# Patient Record
Sex: Male | Born: 1970 | Race: Black or African American | Hispanic: No | Marital: Married | Smoking: Never smoker
Health system: Southern US, Community
[De-identification: ages and names within clinical notes are randomized; demographics above are authoritative.]

## PROBLEM LIST (undated history)

## (undated) DIAGNOSIS — T7840XA Allergy, unspecified, initial encounter: Secondary | ICD-10-CM

## (undated) DIAGNOSIS — I10 Essential (primary) hypertension: Secondary | ICD-10-CM

## (undated) HISTORY — PX: COARCTATION OF AORTA REPAIR: SHX261

## (undated) HISTORY — DX: Allergy, unspecified, initial encounter: T78.40XA

---

## 1998-05-07 ENCOUNTER — Ambulatory Visit (HOSPITAL_COMMUNITY): Admission: RE | Admit: 1998-05-07 | Discharge: 1998-05-07 | Payer: Self-pay | Admitting: Family Medicine

## 2003-03-03 ENCOUNTER — Ambulatory Visit (HOSPITAL_COMMUNITY): Admission: RE | Admit: 2003-03-03 | Discharge: 2003-03-03 | Payer: Self-pay | Admitting: Cardiology

## 2003-03-03 ENCOUNTER — Encounter: Payer: Self-pay | Admitting: Cardiology

## 2003-03-19 ENCOUNTER — Encounter: Payer: Self-pay | Admitting: Cardiology

## 2003-03-19 ENCOUNTER — Ambulatory Visit (HOSPITAL_COMMUNITY): Admission: RE | Admit: 2003-03-19 | Discharge: 2003-03-19 | Payer: Self-pay | Admitting: Cardiology

## 2011-03-04 NOTE — Cardiovascular Report (Signed)
NAMEFAUSTINO, Mark Bruce                     ACCOUNT NO.:  [id number]   MEDICAL RECORD NO.:  [id number]                   PATIENT TYPE:  OIB   LOCATION:  2861                                 FACILITY:  [facility]   PHYSICIAN:  Cristy Hilts. Jacinto Halim, M.D.                  DATE OF BIRTH:  26-Aug-1971   DATE OF PROCEDURE:  03/03/2003  DATE OF DISCHARGE:                              CARDIAC CATHETERIZATION   REFERRING PHYSICIAN:  Renaye Rakers, M.D.   PROCEDURES:  1. Abdominal aortogram.  2. Ascending aortogram.  3. Aortic arch angiography.   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  His physical examination was  consistent with coarctation of the aorta, which is the suprarenal aorta.  Given this, he was brought to the catheterization lab to evaluate his aortic  arch.   HEMODYNAMIC DATA:  1. The ascending aortic pressures were 140/87 with a mean of 110 mmHg.  2. The descending thoracic aortic pressures were 99/82 with a mean of 91     mmHg.  3. There was a pressure gradient of 45 mmHg between the descending abdominal     aorta and the arch of the aorta.  This was highly suspicious for     coarctation.   ANGIOGRAPHIC DATA:  1. Abdominal aortogram:  The abdominal aortogram revealed widely patent     abdominal aorta.  There were two renal arteries, one on either side, and     they were widely patent.  The aortoiliac bifurcation was widely patent.     The iliac arteries were normal.  2. Ascending aorta and arch of the aorta:  The ascending aorta was normal.     There was  no evidence of aortic valve regurgitation and no evidence of     aortic dissection.  The right innominate artery was normal, and the left     subclavian artery was normal.  Just  off the origin of the left     subclavian artery there was coarctation of the aorta noted, which was     very focal.   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  This is hemodynamically significant  with a pressure gradient of 50 mmHg across the coarctation.   RECOMMENDATIONS:  The patient will proceed with evaluation of the  coarctation of the aorta by MRA.  This will also help in determining whether  he is a candidate for correction either by percutaneous or by surgical  route.  Further recommendations will follow.   DESCRIPTION OF PROCEDURE:  Under the usual sterile precautions using the  left brachial percutaneous arterial access approach with Seldinger  technique, a 5 French short arterial sheath was introduced into the brachial  artery.  Intra-arterial heparin 4000 units through the sheath and 200 mcg of  intra-arterial nitroglycerin were also administered prior to doing the  catheterization.  Then a 5 French pigtail catheter was easily introduced  into the arch of the aorta and with the help of the 0.035 inch Wholey wire,  the wire was manipulated to engage the descending thoracic aorta and the  pigtail catheter was transferred into the descending and thoracic aorta and  an abdominal aortogram was performed.  Then the catheter was manipulated,  pulled back into the arch of the aorta, and ascending  aortogram and arch aortogram were performed.  Hemodynamics were also  measured with calculation of pressure gradient across the coarctation.  Then  the catheter was pulled out of the body.  The patient tolerated the  procedure well.                                                Cristy Hilts. Jacinto Halim, M.D.    Pilar Plate  D:  03/03/2003  T:  03/04/2003  Job:  [id number]   cc:   Renaye Rakers, M.D.  [phone] [address], [address]  [place]  [place] [address]  Fax: [phone]

## 2012-05-22 ENCOUNTER — Emergency Department (HOSPITAL_COMMUNITY)
Admission: EM | Admit: 2012-05-22 | Discharge: 2012-05-22 | Disposition: A | Discharge status: Home / Self Care | Payer: BC Managed Care – PPO | Source: Home / Self Care | Attending: Emergency Medicine | Admitting: Emergency Medicine

## 2012-05-22 ENCOUNTER — Encounter (HOSPITAL_COMMUNITY): Payer: Self-pay

## 2012-05-22 DIAGNOSIS — K4090 Unilateral inguinal hernia, without obstruction or gangrene, not specified as recurrent: Secondary | ICD-10-CM

## 2012-05-22 HISTORY — DX: Essential (primary) hypertension: I10

## 2012-05-22 MED ORDER — HYDROCODONE-ACETAMINOPHEN 5-325 MG PO TABS: ORAL_TABLET | ORAL | Status: AC

## 2012-05-22 NOTE — ED Provider Notes (Signed)
Chief Complaint  Patient presents with  . Inguinal Hernia    History of Present Illness:    Mr. Marrow is a 41 year old male who has had a two-year history of a right inguinal hernia. He saw Dr. Claud Kelp for this last January and was told to have it repaired when started to bother him. It hadn't been bothering him much up until last night when he's had pain in the groin area. He is able to reduce the hernia. He denies any fever, chills, nausea, vomiting, or abdominal swelling. His bowel movements have been regular. He's had no urinary symptoms or testicular pain. He went to Dr. Jacinto Halim office today and was told that Dr. Derrell Lolling would not be able to see him until the 23rd of this month, but he felt that was too long to wait so he came here.  Review of Systems:  Other than noted above, the patient denies any of the following symptoms: Constitutional:  No fever, chills, fatigue, weight loss or anorexia. Lungs:  No cough or shortness of breath. Heart:  No chest pain, palpitations, syncope or edema.  No cardiac history. Abdomen:  No nausea, vomiting, hematememesis, melena, diarrhea, or hematochezia. GU:  No dysuria, frequency, urgency, or hematuria.  No testicular pain or swelling. Skin:  No rash or itching.  PMFSH:  Past medical history, family history, social history, meds, and allergies were reviewed along with nurse's notes.  No prior abdominal surgeries or history of GI problems.  No use of NSAIDs or aspirin.  No excessive  alcohol intake.  Physical Exam:   Vital signs:  BP 169/93  Pulse 80  Temp 98.5 F (36.9 C) (Oral)  Resp 18  SpO2 100% Gen:  Alert, oriented, in no distress. Lungs:  Breath sounds clear and equal bilaterally.  No wheezes, rales or rhonchi. Heart:  Regular rhythm.  No gallops or murmers.   Abdomen:  Abdomen is soft, flat, nondistended, and nontender. There is no organomegaly or mass. Bowel sounds are normal. He has a small, easily reducible, but tender right  inguinal hernia. His testes are normal. No testicular tenderness. Genital exam is normal. No urethral discharge. Skin:  Clear, warm and dry.  No rash.  Assessment:  The encounter diagnosis was Inguinal hernia. He has a symptomatic right inguinal hernia, but there is no evidence of incarceration or strangulation. There is no need for him to go to the emergency room today. I did call Dr. Jacinto Halim office and they did graciously consult to see him tomorrow at 2 PM. The patient was told in the meantime to stay off his feet, no heavy lifting, apply ice, and if he should develop any abdominal distention, nausea, or vomiting to go directly to the emergency room.  Plan:   1.  The following meds were prescribed:   New Prescriptions   HYDROCODONE-ACETAMINOPHEN (NORCO/VICODIN) 5-325 MG PER TABLET    1 to 2 tabs every 4 to 6 hours as needed for pain.   2.  The patient was instructed in symptomatic care and handouts were given. 3.  The patient was told to return if becoming worse in any way, if no better in 3 or 4 days, and given some red flag symptoms that would indicate earlier return.    Reuben Likes, MD 05/22/12 1200

## 2012-05-22 NOTE — ED Notes (Signed)
Pt states he has known rt inguinal hernia- was seen at [facility] Last Jan for same and told he did not need surgery.  Reports last night he began having sharp pain in the rt inguinal area. Reports pain is worse with movement and coughing.  States there has been swelling/"bulge" in the area for a while, this is not new.

## 2012-05-23 ENCOUNTER — Encounter (INDEPENDENT_AMBULATORY_CARE_PROVIDER_SITE_OTHER): Payer: Self-pay | Admitting: General Surgery

## 2012-05-23 ENCOUNTER — Ambulatory Visit (INDEPENDENT_AMBULATORY_CARE_PROVIDER_SITE_OTHER): Payer: BC Managed Care – PPO | Admitting: General Surgery

## 2012-05-23 VITALS — BP 170/82 | HR 72 | Temp 98.4°F | Resp 18 | Ht 72.0 in | Wt 220.0 lb

## 2012-05-23 DIAGNOSIS — K4090 Unilateral inguinal hernia, without obstruction or gangrene, not specified as recurrent: Secondary | ICD-10-CM

## 2012-05-23 NOTE — Progress Notes (Signed)
Patient ID: BERN FARE, male   DOB: [birth year], 41 y.o.   MRN: [id number]  Chief Complaint  Patient presents with  . New Evaluation    eval hernia    HPI Mark Bruce is a 41 y.o. male.  This patient is referred for evaluation of right inguinal hernia. He says that he has noticed this hernia with a bulge in his right groin for the last 2 years and has been mildly symptomatic over the last year. About one year ago he began having some discomfort in the area and he saw Dr. Derrell Lolling for evaluation and surgery was recommended. However, at the time, he did not have financial stability to perform the surgery. He has been taking Aleve for discomfort but it has been increasing in pain. 2 days ago he had increased "sharp" pains in the are of a seen at the urgent care facility and he was referred for surgery. He denies any nausea or vomiting or obstructive symptoms. He states that his bowels are normal. States that the bulge reduces. HPI  Past Medical History  Diagnosis Date  . Hypertension     No past surgical history on file.  No family history on file.  Social History History  Substance Use Topics  . Smoking status: Never Smoker   . Smokeless tobacco: Not on file  . Alcohol Use: No    No Known Allergies  Current Outpatient Prescriptions  Medication Sig Dispense Refill  . amLODipine (NORVASC) 10 MG tablet Take 10 mg by mouth daily.      . benazepril (LOTENSIN) 40 MG tablet Take 40 mg by mouth daily.      . hydrochlorothiazide (HYDRODIURIL) 25 MG tablet Take 25 mg by mouth daily.      Marland Kitchen HYDROcodone-acetaminophen (NORCO/VICODIN) 5-325 MG per tablet 1 to 2 tabs every 4 to 6 hours as needed for pain.  20 tablet  0    Review of Systems Review of Systems All other review of systems negative or noncontributory except as stated in the HPI  Blood pressure 170/82, pulse 72, temperature 98.4 F (36.9 C), resp. rate 18, height 6' (1.829 m), weight 220 lb (99.791 kg).  Physical  Exam Physical Exam Physical Exam  Vitals reviewed. Constitutional: He is oriented to person, place, and time. He appears well-developed and well-nourished. No distress.  HENT:  Head: Normocephalic and atraumatic.  Mouth/Throat: No oropharyngeal exudate.  Eyes: Conjunctivae and EOM are normal. Pupils are equal, round, and reactive to light. Right eye exhibits no discharge. Left eye exhibits no discharge. No scleral icterus.  Neck: Normal range of motion. No tracheal deviation present.  Cardiovascular: Normal rate, regular rhythm and normal heart sounds.   Pulmonary/Chest: Effort normal and breath sounds normal. No stridor. No respiratory distress. He has no wheezes. He has no rales. He exhibits no tenderness.  Abdominal: Soft. Bowel sounds are normal. He exhibits no distension and no mass. There is no tenderness. There is no rebound and no guarding. he has a moderate size reducible right inguinal bulge. Again, this is easily reducible on exam. No left anal hernia is appreciated. Musculoskeletal: Normal range of motion. He exhibits no edema and no tenderness.  Neurological: He is alert and oriented to person, place, and time.  Skin: Skin is warm and dry. No rash noted. He is not diaphoretic. No erythema. No pallor.  Psychiatric: He has a normal mood and affect. His behavior is normal. Judgment and thought content normal.    Data Reviewed  Assessment    Reducible right inguinal hernia-symptomatic He does have a reducible right inguinal hernia on exam which is symptomatic. I discussed with him the options for watchful waiting versus laparoscopic or open hernia repair and he is interested in open right inguinal hernia repair. We discussed the risks of surgery including infection, bleeding, pain, scarring, recurrence, injury to the testicle or vas deferens, chronic pain, numbness, or nerve injury and he expressed understanding and would like to proceed with open right inguinal hernia repair with  mesh as soon as possible.    Plan    We will schedule open right inguinal hernia repair with mesh as soon as possible       Mark Bruce DAVID 05/23/2012, 2:30 PM

## 2012-06-07 DIAGNOSIS — K4090 Unilateral inguinal hernia, without obstruction or gangrene, not specified as recurrent: Secondary | ICD-10-CM

## 2012-06-07 HISTORY — PX: HERNIA REPAIR: SHX51

## 2012-06-08 ENCOUNTER — Ambulatory Visit (INDEPENDENT_AMBULATORY_CARE_PROVIDER_SITE_OTHER): Payer: BC Managed Care – PPO | Admitting: General Surgery

## 2012-06-27 ENCOUNTER — Telehealth (INDEPENDENT_AMBULATORY_CARE_PROVIDER_SITE_OTHER): Payer: Self-pay

## 2012-06-27 NOTE — Telephone Encounter (Signed)
Left message notifying patient of appointment for 06/28/12 has been rescheduled due to office cancellation.  Patient has new follow up appointment for 07/06/12 w/Dr. Biagio Quint.

## 2012-06-28 ENCOUNTER — Encounter (INDEPENDENT_AMBULATORY_CARE_PROVIDER_SITE_OTHER): Payer: BC Managed Care – PPO | Admitting: General Surgery

## 2012-07-06 ENCOUNTER — Encounter (INDEPENDENT_AMBULATORY_CARE_PROVIDER_SITE_OTHER): Payer: Self-pay | Admitting: General Surgery

## 2012-07-06 ENCOUNTER — Ambulatory Visit (INDEPENDENT_AMBULATORY_CARE_PROVIDER_SITE_OTHER): Payer: BC Managed Care – PPO | Admitting: General Surgery

## 2012-07-06 VITALS — BP 182/100 | HR 68 | Temp 97.8°F | Resp 16 | Ht 72.0 in | Wt 220.6 lb

## 2012-07-06 DIAGNOSIS — Z4889 Encounter for other specified surgical aftercare: Secondary | ICD-10-CM

## 2012-07-06 DIAGNOSIS — Z5189 Encounter for other specified aftercare: Secondary | ICD-10-CM

## 2012-07-06 NOTE — Progress Notes (Signed)
Subjective:     Patient ID: Mark Bruce, male   DOB: [birth year], 41 y.o.   MRN: [id number]  HPI This patient follows up one month status post open repair of a right inguinal hernia. He has no complaints. He is off pain medication and is gradually increasing his activity. He denies any discomfort in area.He is scheduled to return to work October 3. Review of Systems     Objective:   Physical Exam No distress and nontoxic-appearing Is incision is healing well without sign of infection. He has a normal healing ridge. There is no evidence of recurrent hernia with Valsalva    Assessment:     Status post open right inguinal hernia He is doing very well from this procedure and has no evidence of any postoperative complications. He can gradually increase his activity as tolerated and return to work without limitations on October 3. He can followup with me on a p.r.n. basis.       Plan:     He can follow up with me on a p.r.n. basis and gradually increase his activity as tolerated.

## 2015-09-16 ENCOUNTER — Encounter: Payer: Self-pay | Admitting: Family Medicine

## 2015-09-16 DIAGNOSIS — T7840XA Allergy, unspecified, initial encounter: Secondary | ICD-10-CM | POA: Insufficient documentation

## 2015-09-16 DIAGNOSIS — I10 Essential (primary) hypertension: Secondary | ICD-10-CM | POA: Insufficient documentation

## 2015-09-22 ENCOUNTER — Encounter: Payer: Self-pay | Admitting: Family Medicine

## 2015-09-22 ENCOUNTER — Ambulatory Visit (INDEPENDENT_AMBULATORY_CARE_PROVIDER_SITE_OTHER): Payer: BLUE CROSS/BLUE SHIELD | Admitting: Family Medicine

## 2015-09-22 VITALS — BP 140/88 | HR 82 | Temp 98.3°F | Resp 16 | Ht 72.0 in | Wt 230.0 lb

## 2015-09-22 DIAGNOSIS — Z86010 Personal history of colonic polyps: Secondary | ICD-10-CM | POA: Diagnosis not present

## 2015-09-22 DIAGNOSIS — I10 Essential (primary) hypertension: Secondary | ICD-10-CM | POA: Diagnosis not present

## 2015-09-22 NOTE — Assessment & Plan Note (Signed)
Mild elevation today. I'm going to obtain the records from his previous primary as well as the cardiologist. He has a soft systolic murmur asked him this is why he had a workup in associated with his hypertension at a young age.

## 2015-09-22 NOTE — Patient Instructions (Addendum)
Continue current medications  Release of records- Dr. Loleta ChanceHill Release of records- Dr. Jacinto HalimGanji F/U 6 months Physical

## 2015-09-22 NOTE — Progress Notes (Signed)
Patient ID: Mark Bruce, male   DOB: Jul 15, 1971, 44 y.o.   MRN: [id number]      Subjective:    Patient ID: Mark Bruce, male    DOB: Jul 15, 1971, 44 y.o.   MRN: [id number]  [facility]  patient here to establish care. Previous primary care provider Dr. Loleta ChanceHill. He was at that office for the past 10 years. He has history of hypertension which is diagnosed about 10 years ago. He states that he was on 3 different blood pressure medications but they stopped one a few months ago. He is currently on amlodipine 10 mg then Hyzaar 100/25 mg once a day. He is history of heart murmur. He states that he had some type of dye study done when I look back in the chart he had procedure done back in 2004 which would've been around the time he was diagnosed with hypertension. He states that it was no blockages anywhere. He also has mild seasonal allergies but otherwise no other medical problems. He does have family history of colon cancer his father passed away from colon cancer at age 42 the time that they found that it had already metastasized. He had colonoscopy at age 44 he had 1 polyp removed. He does not recall the gastroenterologist.  He was seen by the eye doctor as well recently he does have some glaucoma in his family his examination was normal. He also follows with Dr. Garner Nashaniels for dental health in [place]  Recent labs done with his physical  Review Of Systems:  GEN- denies fatigue, fever, weight loss,weakness, recent illness HEENT- denies eye drainage, change in vision, nasal discharge, CVS- denies chest pain, palpitations RESP- denies SOB, cough, wheeze ABD- denies N/V, change in stools, abd pain GU- denies dysuria, hematuria, dribbling, incontinence MSK- denies joint pain, muscle aches, injury Neuro- denies headache, dizziness, syncope, seizure activity       Objective:    BP 140/88 mmHg  Pulse 82  Temp(Src) 98.3 F (36.8 C) (Oral)   Resp 16  Ht 6' (1.829 m)  Wt 230 lb (104.327 kg)  BMI 31.19 kg/m2 GEN- NAD, alert and oriented x3 , BP recheck 148/88 HEENT- PERRL, EOMI, non injected sclera, pink conjunctiva, MMM, oropharynx clear Neck- Supple, no thyromegaly CVS- RRR, no murmur RESP-CTAB ABD-NABS,soft,NT,ND Psych- normal affect and mood  EXT- No edema Pulses- Radial, DP- 2+        Assessment & Plan:      Problem List Items Addressed This Visit    Hypertension - Primary    Mild elevation today. I'm going to obtain the records from his previous primary as well as the cardiologist. He has a soft systolic murmur asked him this is why he had a workup in associated with his hypertension at a young age.       Relevant Medications   losartan-hydrochlorothiazide (HYZAAR) 100-25 MG tablet   History of colonic polyps      Note: This dictation was prepared with Dragon dictation along with smaller phrase technology. Any transcriptional errors that result from this process are unintentional.

## 2015-10-07 ENCOUNTER — Telehealth: Payer: Self-pay | Admitting: Family Medicine

## 2015-10-07 NOTE — Telephone Encounter (Signed)
Pt was told to call and let Dr. Jeanice Limurham know Dr. Amlodipine 10 mg.   [facility] [phone]

## 2015-10-09 ENCOUNTER — Telehealth: Payer: Self-pay | Admitting: *Deleted

## 2015-10-09 MED ORDER — AMLODIPINE BESYLATE 10 MG PO TABS: 10.0000 mg | ORAL_TABLET | Freq: Every day | ORAL | Status: DC

## 2015-10-09 NOTE — Telephone Encounter (Signed)
Patient should continue current medications.   Prescription sent to pharmacy.

## 2015-10-09 NOTE — Telephone Encounter (Signed)
Pt called needing refill on Amlodipine states is new pt was seen by Dr. Jeanice Limurham and was not sure if he is suppose to be taking the meds, there was a lot of confusion. Pt only wants 30 days right now.  [facility]

## 2015-11-11 ENCOUNTER — Other Ambulatory Visit: Payer: Self-pay | Admitting: *Deleted

## 2015-11-11 MED ORDER — AMLODIPINE BESYLATE 10 MG PO TABS: 10.0000 mg | ORAL_TABLET | Freq: Every day | ORAL | Status: DC

## 2015-11-11 NOTE — Telephone Encounter (Signed)
Received fax requesting refill on Norvasc.   Refill appropriate and filled per protocol.  

## 2015-12-01 ENCOUNTER — Other Ambulatory Visit: Payer: Self-pay | Admitting: Family Medicine

## 2015-12-01 NOTE — Telephone Encounter (Signed)
Refill appropriate and filled per protocol. 

## 2015-12-16 ENCOUNTER — Encounter: Payer: Self-pay | Admitting: Family Medicine

## 2015-12-16 ENCOUNTER — Telehealth: Payer: Self-pay | Admitting: *Deleted

## 2015-12-16 ENCOUNTER — Ambulatory Visit (INDEPENDENT_AMBULATORY_CARE_PROVIDER_SITE_OTHER): Payer: BLUE CROSS/BLUE SHIELD | Admitting: Family Medicine

## 2015-12-16 VITALS — BP 152/84 | HR 70 | Temp 98.1°F | Resp 14 | Ht 72.0 in | Wt 227.0 lb

## 2015-12-16 DIAGNOSIS — Z0000 Encounter for general adult medical examination without abnormal findings: Secondary | ICD-10-CM | POA: Diagnosis not present

## 2015-12-16 DIAGNOSIS — Z23 Encounter for immunization: Secondary | ICD-10-CM | POA: Diagnosis not present

## 2015-12-16 DIAGNOSIS — Z125 Encounter for screening for malignant neoplasm of prostate: Secondary | ICD-10-CM

## 2015-12-16 DIAGNOSIS — K4090 Unilateral inguinal hernia, without obstruction or gangrene, not specified as recurrent: Secondary | ICD-10-CM | POA: Diagnosis not present

## 2015-12-16 DIAGNOSIS — E669 Obesity, unspecified: Secondary | ICD-10-CM | POA: Diagnosis not present

## 2015-12-16 DIAGNOSIS — R5383 Other fatigue: Secondary | ICD-10-CM | POA: Diagnosis not present

## 2015-12-16 DIAGNOSIS — I10 Essential (primary) hypertension: Secondary | ICD-10-CM | POA: Diagnosis not present

## 2015-12-16 DIAGNOSIS — N400 Enlarged prostate without lower urinary tract symptoms: Secondary | ICD-10-CM

## 2015-12-16 DIAGNOSIS — E663 Overweight: Secondary | ICD-10-CM

## 2015-12-16 LAB — TSH: TSH: 1.48 m[IU]/L (ref 0.40–4.50)

## 2015-12-16 LAB — COMPREHENSIVE METABOLIC PANEL
ALK PHOS: 58 U/L (ref 40–115)
ALT: 21 U/L (ref 9–46)
AST: 16 U/L (ref 10–40)
Albumin: 4.6 g/dL (ref 3.6–5.1)
BUN: 9 mg/dL (ref 7–25)
CO2: 26 mmol/L (ref 20–31)
CREATININE: 0.82 mg/dL (ref 0.60–1.35)
Calcium: 9.4 mg/dL (ref 8.6–10.3)
Chloride: 101 mmol/L (ref 98–110)
GLUCOSE: 86 mg/dL (ref 70–99)
Potassium: 3.6 mmol/L (ref 3.5–5.3)
SODIUM: 135 mmol/L (ref 135–146)
Total Bilirubin: 1.3 mg/dL — ABNORMAL HIGH (ref 0.2–1.2)
Total Protein: 7.8 g/dL (ref 6.1–8.1)

## 2015-12-16 LAB — CBC WITH DIFFERENTIAL/PLATELET
BASOS ABS: 0 10*3/uL (ref 0.0–0.1)
BASOS PCT: 1 % (ref 0–1)
EOS PCT: 5 % (ref 0–5)
Eosinophils Absolute: 0.2 10*3/uL (ref 0.0–0.7)
HCT: 44.3 % (ref 39.0–52.0)
Hemoglobin: 14.8 g/dL (ref 13.0–17.0)
LYMPHS PCT: 46 % (ref 12–46)
Lymphs Abs: 2.1 10*3/uL (ref 0.7–4.0)
MCH: 33.6 pg (ref 26.0–34.0)
MCHC: 33.4 g/dL (ref 30.0–36.0)
MCV: 100.7 fL — ABNORMAL HIGH (ref 78.0–100.0)
MONO ABS: 0.5 10*3/uL (ref 0.1–1.0)
MPV: 10.5 fL (ref 8.6–12.4)
Monocytes Relative: 11 % (ref 3–12)
NEUTROS ABS: 1.7 10*3/uL (ref 1.7–7.7)
Neutrophils Relative %: 37 % — ABNORMAL LOW (ref 43–77)
Platelets: 194 10*3/uL (ref 150–400)
RBC: 4.4 MIL/uL (ref 4.22–5.81)
RDW: 12.2 % (ref 11.5–15.5)
WBC: 4.6 10*3/uL (ref 4.0–10.5)

## 2015-12-16 LAB — LIPID PANEL
Cholesterol: 149 mg/dL (ref 125–200)
HDL: 47 mg/dL (ref >=40)
LDL CALC: 79 mg/dL (ref <130)
Total CHOL/HDL Ratio: 3.2 Ratio (ref <=5.0)
Triglycerides: 115 mg/dL (ref <150)
VLDL: 23 mg/dL (ref <30)

## 2015-12-16 NOTE — Assessment & Plan Note (Signed)
The pressure still elevated today however he is a little nervous. We then consider adding Hytrin to his meds as this will help with his BPH.

## 2015-12-16 NOTE — Telephone Encounter (Signed)
Pt has appt scheduled for Mon March 6 at 1:30pm with arrival at 1:10pm, pt is to drink 32oz water one hr prior to appt and hold, pt appt at [facility] imaging at [facility] center at [address] [place], [facility] to pt for appt

## 2015-12-16 NOTE — Progress Notes (Signed)
Patient ID: Mark Bruce, male   DOB: 02/12/1971, 45 y.o.   MRN: [id number]   Subjective:    Patient ID: Mark Bruce, male    DOB: 02-07-1971, 45 y.o.   MRN: [id number]  Patient presents for CPE  here for complete physical exam. He still taken his blood pressure medicine as prescribed. He has a few concerns today. He had inguinal hernia surgery done on the right side in 2013 for the past few months he's felt pain in the same area if he lifts or does any type of Valsalva maneuver he will get significant pain he has not had any bulge that he notices.  He's had difficulty with his urine stream he noticed that it is slower than usual he is not to the point of dripping. He denies any dysuria. He would like to have a PSA done and to have a prostate check. He also states that he has had difficulty with his erections and some fatigue she would like to have his testosterone checked. He is aware that his hypertension since his young 20s and his medications contribute to some of his erectile problems.  Review Of Systems:  GEN- denies fatigue, fever, weight loss,weakness, recent illness HEENT- denies eye drainage, change in vision, nasal discharge, CVS- denies chest pain, palpitations RESP- denies SOB, cough, wheeze ABD- denies N/V, change in stools, abd pain GU- denies dysuria, hematuria, dribbling, incontinence MSK- denies joint pain, muscle aches, injury Neuro- denies headache, dizziness, syncope, seizure activity       Objective:    BP 152/84 mmHg  Pulse 70  Temp(Src) 98.1 F (36.7 C) (Oral)  Resp 14  Ht 6' (1.829 m)  Wt 227 lb (102.967 kg)  BMI 30.78 kg/m2 GEN- NAD, alert and oriented x3 HEENT- PERRL, EOMI, non injected sclera, pink conjunctiva, MMM, oropharynx clear Neck- Supple, no thyromegaly CVS- RRR, no murmur RESP-CTAB ABD-NABS,soft,NT,ND GU- prostate enlarged, no nodules, FOBT neg, normal rectal tone, small scabs previous boils on buttocks, penis normal, scrotum  no bulge, previous hernia site, mild TTP, no swelling  EXT- No edema Pulses- Radial, DP- 2+        Assessment & Plan:      Problem List Items Addressed This Visit    Obesity   Hypertension    The pressure still elevated today however he is a little nervous. We then consider adding Hytrin to his meds as this will help with his BPH.      BPH (benign prostatic hypertrophy)    Other Visit Diagnoses    Routine general medical examination at a health care facility    -  Primary    CPE done, TDAP given, fasting labs    Relevant Orders    CBC with Differential/Platelet    Comprehensive metabolic panel    Lipid panel    Prostate cancer screening        Enlarged prostate, check PSA, will likely try Hytrin due to his HTN    Relevant Orders    PSA    Other fatigue        Check testosterone, TSH    Relevant Orders    TSH    Testosterone    Unilateral inguinal hernia without obstruction or gangrene, recurrence not specified        Previous hernia site, with pain, no bulge seen, ? if he has recurrence, pelvic US to be done     Relevant Orders    US Pelvis Limited  Note: This dictation was prepared with Dragon dictation along with smaller phrase technology. Any transcriptional errors that result from this process are unintentional.

## 2015-12-16 NOTE — Patient Instructions (Addendum)
Release of records- Gastroenterology ( Leave Blank )  Ultrasound of hernia - evening appt  We will call with lab results  Tetanus Booster given  F/u 6 MONTHS

## 2015-12-17 LAB — PSA: PSA: 2.3 ng/mL (ref <=4.00)

## 2015-12-17 LAB — TESTOSTERONE: TESTOSTERONE: 410 ng/dL (ref 250–827)

## 2015-12-17 NOTE — Telephone Encounter (Signed)
Pt aware of appt.

## 2015-12-21 ENCOUNTER — Ambulatory Visit
Admission: RE | Admit: 2015-12-21 | Discharge: 2015-12-21 | Disposition: A | Discharge status: Home / Self Care | Payer: BLUE CROSS/BLUE SHIELD | Source: Ambulatory Visit | Attending: Family Medicine | Admitting: Family Medicine

## 2015-12-21 ENCOUNTER — Other Ambulatory Visit: Payer: Self-pay | Admitting: *Deleted

## 2015-12-21 DIAGNOSIS — K4090 Unilateral inguinal hernia, without obstruction or gangrene, not specified as recurrent: Secondary | ICD-10-CM

## 2015-12-21 MED ORDER — TERAZOSIN HCL 1 MG PO CAPS: 1.0000 mg | ORAL_CAPSULE | Freq: Every day | ORAL | Status: DC

## 2016-01-28 ENCOUNTER — Encounter: Payer: Self-pay | Admitting: Family Medicine

## 2016-02-24 ENCOUNTER — Other Ambulatory Visit: Payer: Self-pay | Admitting: *Deleted

## 2016-02-24 MED ORDER — TERAZOSIN HCL 1 MG PO CAPS: 1.0000 mg | ORAL_CAPSULE | Freq: Every day | ORAL | Status: DC

## 2016-02-24 NOTE — Telephone Encounter (Signed)
Received fax requesting refill on terazosin with 90 day supply.   Refill appropriate and filled per protocol.

## 2016-05-23 ENCOUNTER — Other Ambulatory Visit: Payer: Self-pay | Admitting: Family Medicine

## 2016-11-22 ENCOUNTER — Other Ambulatory Visit: Payer: Self-pay | Admitting: Family Medicine

## 2016-11-22 NOTE — Telephone Encounter (Signed)
Medication refill for one time only.  Patient needs to be seen.  

## 2016-12-19 ENCOUNTER — Other Ambulatory Visit: Payer: Self-pay | Admitting: *Deleted

## 2016-12-19 NOTE — Telephone Encounter (Signed)
Received VM from patient.   Requested to schedule appointment with MD.   Please call patient to schedule.

## 2016-12-21 MED ORDER — LOSARTAN POTASSIUM-HCTZ 100-25 MG PO TABS: ORAL_TABLET | ORAL | 0 refills | Status: DC

## 2016-12-21 MED ORDER — AMLODIPINE BESYLATE 10 MG PO TABS: 10.0000 mg | ORAL_TABLET | Freq: Every day | ORAL | 0 refills | Status: DC

## 2016-12-21 NOTE — Telephone Encounter (Signed)
Is running out of BP meds.  Told needs to make appt to be seen.  Made appt for early morning next week so he can come before work.  Told him to come fasting for any labs provider may want to do.  Will send 1 month supply only of BP meds so he does not run out.

## 2016-12-28 ENCOUNTER — Ambulatory Visit (INDEPENDENT_AMBULATORY_CARE_PROVIDER_SITE_OTHER): Payer: Managed Care, Other (non HMO) | Admitting: Family Medicine

## 2016-12-28 ENCOUNTER — Encounter: Payer: Self-pay | Admitting: Family Medicine

## 2016-12-28 VITALS — BP 142/68 | HR 88 | Temp 98.9°F | Resp 14 | Ht 72.0 in | Wt 227.0 lb

## 2016-12-28 DIAGNOSIS — E6609 Other obesity due to excess calories: Secondary | ICD-10-CM | POA: Diagnosis not present

## 2016-12-28 DIAGNOSIS — N401 Enlarged prostate with lower urinary tract symptoms: Secondary | ICD-10-CM

## 2016-12-28 DIAGNOSIS — Z6830 Body mass index (BMI) 30.0-30.9, adult: Secondary | ICD-10-CM

## 2016-12-28 DIAGNOSIS — I10 Essential (primary) hypertension: Secondary | ICD-10-CM

## 2016-12-28 DIAGNOSIS — L309 Dermatitis, unspecified: Secondary | ICD-10-CM | POA: Diagnosis not present

## 2016-12-28 LAB — CBC WITH DIFFERENTIAL/PLATELET
BASOS ABS: 40 {cells}/uL (ref 0–200)
Basophils Relative: 1 %
EOS ABS: 240 {cells}/uL (ref 15–500)
Eosinophils Relative: 6 %
HEMATOCRIT: 44.9 % (ref 38.5–50.0)
Hemoglobin: 15.1 g/dL (ref 13.0–17.0)
Lymphocytes Relative: 38 %
Lymphs Abs: 1520 cells/uL (ref 850–3900)
MCH: 34.5 pg — AB (ref 27.0–33.0)
MCHC: 33.6 g/dL (ref 32.0–36.0)
MCV: 102.5 fL — AB (ref 80.0–100.0)
MONO ABS: 480 {cells}/uL (ref 200–950)
MONOS PCT: 12 %
MPV: 10 fL (ref 7.5–12.5)
NEUTROS ABS: 1720 {cells}/uL (ref 1500–7800)
Neutrophils Relative %: 43 %
PLATELETS: 174 10*3/uL (ref 140–400)
RBC: 4.38 MIL/uL (ref 4.20–5.80)
RDW: 12.4 % (ref 11.0–15.0)
WBC: 4 10*3/uL (ref 3.8–10.8)

## 2016-12-28 LAB — COMPREHENSIVE METABOLIC PANEL
ALBUMIN: 4.2 g/dL (ref 3.6–5.1)
ALT: 28 U/L (ref 9–46)
AST: 24 U/L (ref 10–40)
Alkaline Phosphatase: 53 U/L (ref 40–115)
BUN: 10 mg/dL (ref 7–25)
CALCIUM: 9.4 mg/dL (ref 8.6–10.3)
CHLORIDE: 101 mmol/L (ref 98–110)
CO2: 29 mmol/L (ref 20–31)
Creat: 0.93 mg/dL (ref 0.60–1.35)
GLUCOSE: 102 mg/dL — AB (ref 70–99)
POTASSIUM: 4.2 mmol/L (ref 3.5–5.3)
Sodium: 138 mmol/L (ref 135–146)
TOTAL PROTEIN: 7.2 g/dL (ref 6.1–8.1)
Total Bilirubin: 1.3 mg/dL — ABNORMAL HIGH (ref 0.2–1.2)

## 2016-12-28 LAB — LIPID PANEL
CHOL/HDL RATIO: 3.2 ratio (ref <5.0)
Cholesterol: 168 mg/dL (ref <200)
HDL: 53 mg/dL (ref >40)
LDL Cholesterol: 98 mg/dL (ref <100)
Triglycerides: 87 mg/dL (ref <150)
VLDL: 17 mg/dL (ref <30)

## 2016-12-28 LAB — PSA: PSA: 1.7 ng/mL (ref <=4.0)

## 2016-12-28 MED ORDER — CLOBETASOL PROPIONATE 0.05 % EX CREA: 1.0000 "application " | TOPICAL_CREAM | Freq: Two times a day (BID) | CUTANEOUS | 1 refills | Status: DC

## 2016-12-28 NOTE — Assessment & Plan Note (Signed)
Blood pressure rechecked and was improved after he sat for a few. I think he would benefit from taking the Terazosin regularly at bedtime in addition to his other 2 blood pressure medications especially with his examination coming up. We also discussed other dietary changes including no additional salt added increasing his water and starting exercise back. He does avoid a monitor blood pressure at home. I will obtain some fasting labs today.

## 2016-12-28 NOTE — Progress Notes (Signed)
   Subjective:    Patient ID: Mark Bruce, male    DOB: 04-08-1971, 46 y.o.   MRN: [id number]  Patient presents for Follow-up (is fasting) Patient here to follow-up his hypertension He was last seen a year ago. 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 He's taken his him loaded pain in the Hyzaar but he is not taking the Terrazas and regular. We will uses for both blood pressure as well as BPH he did not notice it big difference in his symptoms for the BPH therefore he takes very randomly. He did have some increase in weight. He has been able to get that back off. He has changed his diet significantly over the past month only grilled foods veggies increasing his water. He is not back to exercising however  He does have a rashes typically pops up during the winter months that is itchy he has it on his lower back as well as his abdomen and his right arm and always come to the same place Review Of Systems:  GEN- denies fatigue, fever, weight loss,weakness, recent illness HEENT- denies eye drainage, change in vision, nasal discharge, CVS- denies chest pain, palpitations RESP- denies SOB, cough, wheeze ABD- denies N/V, change in stools, abd pain GU- denies dysuria, hematuria, dribbling, incontinence MSK- denies joint pain, muscle aches, injury Neuro- denies headache, dizziness, syncope, seizure activity       Objective:    BP (!) 142/68   Pulse 88   Temp 98.9 F (37.2 C) (Oral)   Resp 14   Ht 6' (1.829 m)   Wt 227 lb (103 kg)   SpO2 98%   BMI 30.79 kg/m  GEN- NAD, alert and oriented x3   Right 138/72  Left 140/76 HEENT- PERRL, EOMI, non injected sclera, pink conjunctiva, MMM, oropharynx clear Neck- Supple, no thyromegaly CVS- RRR, no murmur RESP-CTAB ABD-NABS,soft,NT,ND Skin- above gluteal cleft plaque like lesion with mild erythema quarter size, 2 small dime size like lesions on right abdomen and right upper arm  EXT- No edema Pulses- Radial,  DP- 2+        Assessment & Plan:      Problem List Items Addressed This Visit    Obesity   Hypertension - Primary    Blood pressure rechecked and was improved after he sat for a few. I think he would benefit from taking the Terazosin regularly at bedtime in addition to his other 2 blood pressure medications especially with his examination coming up. We also discussed other dietary changes including no additional salt added increasing his water and starting exercise back. He does avoid a monitor blood pressure at home. I will obtain some fasting labs today.      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 or just smaller psoriatic lesions, trial of clobetasol      Note: This dictation was prepared with Dragon dictation along with smaller phrase technology. Any transcriptional errors that result from this process are unintentional.

## 2016-12-28 NOTE — Patient Instructions (Addendum)
F/u 6 MONTH for PHYSICAL

## 2016-12-30 ENCOUNTER — Encounter: Payer: Self-pay | Admitting: *Deleted

## 2017-01-17 ENCOUNTER — Other Ambulatory Visit: Payer: Self-pay | Admitting: Family Medicine

## 2017-01-17 NOTE — Telephone Encounter (Signed)
Medication refilled per protocol. 

## 2017-02-13 ENCOUNTER — Other Ambulatory Visit: Payer: Self-pay | Admitting: *Deleted

## 2017-02-13 MED ORDER — LOSARTAN POTASSIUM-HCTZ 100-25 MG PO TABS: 1.0000 | ORAL_TABLET | Freq: Every day | ORAL | 1 refills | Status: DC

## 2017-02-13 MED ORDER — TERAZOSIN HCL 1 MG PO CAPS: 1.0000 mg | ORAL_CAPSULE | Freq: Every day | ORAL | 1 refills | Status: DC

## 2017-02-13 MED ORDER — AMLODIPINE BESYLATE 10 MG PO TABS
10.0000 mg | ORAL_TABLET | Freq: Every day | ORAL | 1 refills | Status: DC
Start: 2017-02-13 — End: 2017-02-17

## 2017-02-17 ENCOUNTER — Other Ambulatory Visit: Payer: Self-pay | Admitting: Family Medicine

## 2017-06-30 ENCOUNTER — Encounter: Payer: Self-pay | Admitting: Family Medicine

## 2017-06-30 ENCOUNTER — Ambulatory Visit (INDEPENDENT_AMBULATORY_CARE_PROVIDER_SITE_OTHER): Payer: Managed Care, Other (non HMO) | Admitting: Family Medicine

## 2017-06-30 VITALS — BP 140/78 | HR 77 | Temp 98.8°F | Resp 16 | Ht 72.0 in | Wt 217.0 lb

## 2017-06-30 DIAGNOSIS — R7301 Impaired fasting glucose: Secondary | ICD-10-CM

## 2017-06-30 DIAGNOSIS — I10 Essential (primary) hypertension: Secondary | ICD-10-CM

## 2017-06-30 DIAGNOSIS — E663 Overweight: Secondary | ICD-10-CM

## 2017-06-30 DIAGNOSIS — Z23 Encounter for immunization: Secondary | ICD-10-CM | POA: Diagnosis not present

## 2017-06-30 DIAGNOSIS — Z0000 Encounter for general adult medical examination without abnormal findings: Secondary | ICD-10-CM

## 2017-06-30 NOTE — Assessment & Plan Note (Signed)
Bp looks okay, borderline elevated systolic He will monitor at home, no change to meds today

## 2017-06-30 NOTE — Addendum Note (Signed)
Addended by: Phineas Semen A on: 06/30/2017 10:27 AM   Modules accepted: Orders

## 2017-06-30 NOTE — Assessment & Plan Note (Signed)
Continue work on diet, add aerobic exercise with his HTN will help with BP and weight

## 2017-06-30 NOTE — Patient Instructions (Signed)
F/U 6 months for blood pressure  We will call with lab results  Schedule eye appointment

## 2017-06-30 NOTE — Progress Notes (Signed)
   Subjective:    Patient ID: Mark Bruce, male    DOB: 05-09-1971, 46 y.o.   MRN: [id number]  Patient presents for Annual Exam Here for physical exam. His medications reviewed family history reviewed Hypertension he is on amlodipine as well as Hyzaar he also has Hytrin which is supposed to help with both his blood pressure and his BPH He's lost 10 pounds in the past 6 months Tetanus up-to-date declines flu shot Discussed HIV screening If fasting labs in 6 months ago his cholesterol was normal his fasting blood glucose was slightly elevated PSA was normal   Colonoscopy UTD had in 2015, due in 2020    Review Of Systems:  GEN- denies fatigue, fever, weight loss,weakness, recent illness HEENT- denies eye drainage, change in vision, nasal discharge, CVS- denies chest pain, palpitations RESP- denies SOB, cough, wheeze ABD- denies N/V, change in stools, abd pain GU- denies dysuria, hematuria, dribbling, incontinence MSK- denies joint pain, muscle aches, injury Neuro- denies headache, dizziness, syncope, seizure activity       Objective:    BP 140/78   Pulse 77   Temp 98.8 F (37.1 C) (Oral)   Resp 16   Ht 6' (1.829 m)   Wt 217 lb (98.4 kg)   SpO2 97%   BMI 29.43 kg/m  GEN- NAD, alert and oriented x3 HEENT- PERRL, EOMI, non injected sclera, pink conjunctiva, MMM, oropharynx clear, TM clear bilat, no effusion Neck- Supple, no thyromegaly CVS- RRR, no murmur RESP-CTAB ABD-NABS,soft,NT,ND EXT- No edema Pulses- Radial, DP- 2+        Assessment & Plan:      Problem List Items Addressed This Visit      Unprioritized   Overweight (BMI 25.0-29.9)    Continue work on diet, add aerobic exercise with his HTN will help with BP and weight      Hypertension    Bp looks okay, borderline elevated systolic He will monitor at home, no change to meds today      Relevant Orders   CBC with Differential/Platelet   Comprehensive metabolic panel    Other Visit  Diagnoses    Routine general medical examination at a health care facility    -  Primary   CPE done, flu shot done, declines HIV screening, PSA neg, normal lipid 6 months ago   Relevant Orders   CBC with Differential/Platelet   Comprehensive metabolic panel   Elevated fasting glucose       Relevant Orders   Hemoglobin A1c      Note: This dictation was prepared with Dragon dictation along with smaller phrase technology. Any transcriptional errors that result from this process are unintentional.

## 2017-07-01 LAB — CBC WITH DIFFERENTIAL/PLATELET
BASOS ABS: 51 {cells}/uL (ref 0–200)
Basophils Relative: 1.3 %
EOS ABS: 254 {cells}/uL (ref 15–500)
Eosinophils Relative: 6.5 %
HEMATOCRIT: 43.9 % (ref 38.5–50.0)
HEMOGLOBIN: 15 g/dL (ref 13.2–17.1)
Lymphs Abs: 1470 cells/uL (ref 850–3900)
MCH: 34.5 pg — AB (ref 27.0–33.0)
MCHC: 34.2 g/dL (ref 32.0–36.0)
MCV: 100.9 fL — ABNORMAL HIGH (ref 80.0–100.0)
MONOS PCT: 13 %
MPV: 10.2 fL (ref 7.5–12.5)
NEUTROS ABS: 1619 {cells}/uL (ref 1500–7800)
Neutrophils Relative %: 41.5 %
Platelets: 199 10*3/uL (ref 140–400)
RBC: 4.35 10*6/uL (ref 4.20–5.80)
RDW: 11.1 % (ref 11.0–15.0)
Total Lymphocyte: 37.7 %
WBC mixed population: 507 cells/uL (ref 200–950)
WBC: 3.9 10*3/uL (ref 3.8–10.8)

## 2017-07-01 LAB — COMPREHENSIVE METABOLIC PANEL
AG RATIO: 1.6 (calc) (ref 1.0–2.5)
ALT: 22 U/L (ref 9–46)
AST: 16 U/L (ref 10–40)
Albumin: 4.4 g/dL (ref 3.6–5.1)
Alkaline phosphatase (APISO): 51 U/L (ref 40–115)
BILIRUBIN TOTAL: 1.3 mg/dL — AB (ref 0.2–1.2)
BUN: 11 mg/dL (ref 7–25)
CALCIUM: 9.5 mg/dL (ref 8.6–10.3)
CO2: 25 mmol/L (ref 20–32)
Chloride: 100 mmol/L (ref 98–110)
Creat: 0.89 mg/dL (ref 0.60–1.35)
GLUCOSE: 98 mg/dL (ref 65–99)
Globulin: 2.7 g/dL (calc) (ref 1.9–3.7)
Potassium: 3.9 mmol/L (ref 3.5–5.3)
Sodium: 135 mmol/L (ref 135–146)
Total Protein: 7.1 g/dL (ref 6.1–8.1)

## 2017-07-01 LAB — HEMOGLOBIN A1C
EAG (MMOL/L): 4.4 (calc)
Hgb A1c MFr Bld: 4.4 % of total Hgb (ref <5.7)
MEAN PLASMA GLUCOSE: 80 (calc)

## 2017-07-04 ENCOUNTER — Encounter: Payer: Self-pay | Admitting: *Deleted

## 2017-07-27 ENCOUNTER — Other Ambulatory Visit: Payer: Self-pay | Admitting: Family Medicine

## 2017-07-27 NOTE — Telephone Encounter (Signed)
Medication refilled per protocol. 

## 2018-01-23 ENCOUNTER — Other Ambulatory Visit: Payer: Self-pay | Admitting: Family Medicine

## 2018-05-17 ENCOUNTER — Encounter: Payer: Self-pay | Admitting: Physician Assistant

## 2018-05-17 ENCOUNTER — Ambulatory Visit (INDEPENDENT_AMBULATORY_CARE_PROVIDER_SITE_OTHER): Payer: Managed Care, Other (non HMO) | Admitting: Physician Assistant

## 2018-05-17 ENCOUNTER — Encounter: Payer: Self-pay | Admitting: Family Medicine

## 2018-05-17 VITALS — BP 160/78 | HR 85 | Temp 98.0°F | Resp 16 | Ht 72.0 in | Wt 229.4 lb

## 2018-05-17 DIAGNOSIS — B029 Zoster without complications: Secondary | ICD-10-CM | POA: Diagnosis not present

## 2018-05-17 MED ORDER — HYDROCODONE-ACETAMINOPHEN 5-325 MG PO TABS: 1.0000 | ORAL_TABLET | Freq: Four times a day (QID) | ORAL | 0 refills | Status: DC | PRN

## 2018-05-17 MED ORDER — VALACYCLOVIR HCL 1 G PO TABS: 1000.0000 mg | ORAL_TABLET | Freq: Three times a day (TID) | ORAL | 0 refills | Status: AC

## 2018-05-17 MED ORDER — GABAPENTIN 300 MG PO CAPS: ORAL_CAPSULE | ORAL | 1 refills | Status: DC

## 2018-05-17 NOTE — Progress Notes (Signed)
Patient ID: Mark Bruce MRN: [id number], DOB: 11/11/1970, 47 y.o. Date of Encounter: 05/17/2018, 9:41 AM    Chief Complaint:  Chief Complaint  Patient presents with  . Rash    on right leg      HPI: 47 y.o. year old male presents with above.    He reports that over the last couple days he has noticed this rash developing on his right upper leg. He reports that the muscles in that right thigh feel sore.  Also states that he does feel some burning discomfort at area of rash.  Reports that the discomfort did affect his sleep last night.  He states that he has been remodeling at his house.  States that there was no central air.  Says that there was a window air conditioning unit that he had gotten at a pond shop and then had bought another one at [employer].  Says that he had recently taken out the old window [facility] unit and some water ran out onto his skin.  Did not know if that had caused this rash.  Otherwise has no other information regarding what may be contributing to this rash/the symptoms.  He has no other concerns to address.     Home Meds:   Outpatient Medications Prior to Visit  Medication Sig Dispense Refill  . amLODipine (NORVASC) 10 MG tablet TAKE 1 TABLET DAILY 90 tablet 1  . losartan-hydrochlorothiazide (HYZAAR) 100-25 MG tablet TAKE 1 TABLET DAILY FOR BLOOD PRESSURE 90 tablet 1  . terazosin (HYTRIN) 1 MG capsule TAKE 1 CAPSULE AT BEDTIME 90 capsule 1   No facility-administered medications prior to visit.     Allergies: No Known Allergies    Review of Systems: See HPI for pertinent ROS. All other ROS negative.    Physical Exam: Blood pressure (!) 150/78, pulse 85, temperature 98 F (36.7 C), temperature source Oral, resp. rate 16, height 6' (1.829 m), weight 104.1 kg (229 lb 6.4 oz), SpO2 96 %., Body mass index is 31.11 kg/m. General:  Male. Appears in no acute distress. Neck: Supple. No thyromegaly. No lymphadenopathy. Lungs: Clear bilaterally to  auscultation without wheezes, rales, or rhonchi. Breathing is unlabored. Heart: Regular rhythm. No murmurs, rubs, or gallops. Msk:  Strength and tone normal for age. Skin:   On his back----at midline and down right buttock----there are clustered vessicles.  On his right medial thigh----- there are patches of erythema with clusters of vessicles.  Anterior right leg--just above level and knee--and just below level of knee--- there are additional patches of erythema with vessicles.  Neuro: Alert and oriented X 3. Moves all extremities spontaneously. Gait is normal. CNII-XII grossly in tact. Psych:  Responds to questions appropriately with a normal affect.     ASSESSMENT AND PLAN:  47 y.o. year old male with   1. Herpes zoster without complication His symptoms and rash are classic for herpes zoster.  Rash follows a linear dermatomal distribution. I reviewed what each medication is being used for and instructions for dosing / how to take them.   He is to take the Valtrex and the gabapentin as directed. Regarding the gabapentin -- after he has been on the 1 p.o. 3 times daily dosing for several days-- if his symptoms are not controlled with this-- then he can call us for further dosing instruction.   Also discussed that the amount of pain and the duration of pain related to shingles varies from person to person.   He may  only need low dose of this medicine and he may only need it for a couple weeks.  Or he may require higher dose or he may require longer duration.   He can determine how long he needs to continue on this medicine.  If it feels that dose needs to be adjusted then call for those instructions. He can use the hydrocodone as needed for breakthrough pain. He works as a Naval architecttruck driver.  Today is his first day being out of work related to this.  Today I am giving him a note to be out today and tomorrow (which is Friday).   We will plan for him to return to work Monday.   Follow-up if  needed. - valACYclovir (VALTREX) 1000 MG tablet; Take 1 tablet (1,000 mg total) by mouth 3 (three) times daily for 7 days.  Dispense: 21 tablet; Refill: 0 - gabapentin (NEURONTIN) 300 MG capsule; Day 1: Take one dose. Day 2: Take one twice per day. Day 3 and after: Take one 3 times a day.  Dispense: 90 capsule; Refill: 1 - HYDROcodone-acetaminophen (NORCO/VICODIN) 5-325 MG tablet; Take 1 tablet by mouth every 6 (six) hours as needed.  Dispense: 30 tablet; Refill: 0   Signed, [address]Darthula Desa Beth LancasterDixon, [place], [facility] 05/17/2018 9:41 AM

## 2018-05-21 ENCOUNTER — Encounter: Payer: Self-pay | Admitting: Family Medicine

## 2018-07-22 ENCOUNTER — Other Ambulatory Visit: Payer: Self-pay | Admitting: Family Medicine

## 2018-12-19 DIAGNOSIS — Z800 Family history of malignant neoplasm of digestive organs: Secondary | ICD-10-CM | POA: Diagnosis not present

## 2018-12-27 DIAGNOSIS — Z1211 Encounter for screening for malignant neoplasm of colon: Secondary | ICD-10-CM | POA: Diagnosis not present

## 2018-12-27 LAB — HM COLONOSCOPY

## 2018-12-28 ENCOUNTER — Encounter: Payer: Self-pay | Admitting: *Deleted

## 2019-02-05 ENCOUNTER — Encounter: Payer: Managed Care, Other (non HMO) | Admitting: Family Medicine

## 2019-07-03 ENCOUNTER — Other Ambulatory Visit: Payer: Self-pay

## 2019-07-03 ENCOUNTER — Ambulatory Visit (INDEPENDENT_AMBULATORY_CARE_PROVIDER_SITE_OTHER): Payer: BC Managed Care – PPO | Admitting: Family Medicine

## 2019-07-03 ENCOUNTER — Encounter: Payer: Self-pay | Admitting: Family Medicine

## 2019-07-03 VITALS — BP 138/82 | HR 86 | Temp 98.4°F | Resp 14 | Ht 72.0 in | Wt 232.0 lb

## 2019-07-03 DIAGNOSIS — Z23 Encounter for immunization: Secondary | ICD-10-CM

## 2019-07-03 DIAGNOSIS — Z125 Encounter for screening for malignant neoplasm of prostate: Secondary | ICD-10-CM

## 2019-07-03 DIAGNOSIS — Z0001 Encounter for general adult medical examination with abnormal findings: Secondary | ICD-10-CM | POA: Diagnosis not present

## 2019-07-03 DIAGNOSIS — Z0000 Encounter for general adult medical examination without abnormal findings: Secondary | ICD-10-CM

## 2019-07-03 DIAGNOSIS — I10 Essential (primary) hypertension: Secondary | ICD-10-CM | POA: Diagnosis not present

## 2019-07-03 DIAGNOSIS — Z6831 Body mass index (BMI) 31.0-31.9, adult: Secondary | ICD-10-CM

## 2019-07-03 DIAGNOSIS — Z13220 Encounter for screening for lipoid disorders: Secondary | ICD-10-CM | POA: Diagnosis not present

## 2019-07-03 DIAGNOSIS — E6609 Other obesity due to excess calories: Secondary | ICD-10-CM

## 2019-07-03 DIAGNOSIS — N401 Enlarged prostate with lower urinary tract symptoms: Secondary | ICD-10-CM

## 2019-07-03 MED ORDER — LOSARTAN POTASSIUM-HCTZ 100-25 MG PO TABS: 1.0000 | ORAL_TABLET | Freq: Every day | ORAL | 4 refills | Status: DC

## 2019-07-03 MED ORDER — TERAZOSIN HCL 1 MG PO CAPS: 1.0000 mg | ORAL_CAPSULE | Freq: Every day | ORAL | 4 refills | Status: DC

## 2019-07-03 MED ORDER — AMLODIPINE BESYLATE 10 MG PO TABS: 10.0000 mg | ORAL_TABLET | Freq: Every day | ORAL | 4 refills | Status: DC

## 2019-07-03 NOTE — Progress Notes (Signed)
   Subjective:    Patient ID: Mark Bruce, male    DOB: 01-23-1971, 48 y.o.   MRN: [id number]  Patient presents for Annual Exam (is not fasting)  Pt here for CPE   History reviewed  Colonoscopy UTD done in march on 5 yeara cycle  Due for PSA screening   Immunizations- TDAP UTD, flu shot DUE  HTN- taking hyzaar and norvasc, hytrin without any difficulty  Taking magnesium 400mg  once a day   He is not exercising   BPH- on hytrin , no difficulty with urine stream    No concerns  Review Of Systems:  GEN- denies fatigue, fever, weight loss,weakness, recent illness HEENT- denies eye drainage, change in vision, nasal discharge, CVS- denies chest pain, palpitations RESP- denies SOB, cough, wheeze ABD- denies N/V, change in stools, abd pain GU- denies dysuria, hematuria, dribbling, incontinence MSK- denies joint pain, muscle aches, injury Neuro- denies headache, dizziness, syncope, seizure activity       Objective:    BP 138/82   Pulse 86   Temp 98.4 F (36.9 C) (Oral)   Resp 14   Ht 6' (1.829 m)   Wt 232 lb (105.2 kg)   SpO2 95%   BMI 31.46 kg/m  GEN- NAD, alert and oriented x3 HEENT- PERRL, EOMI, non injected sclera, pink conjunctiva, MMM, oropharynx clear Neck- Supple, no thyromegaly CVS- RRR, no murmur RESP-CTAB ABD-NABS,soft,NT,ND EXT- No edema Pulses- Radial, DP- 2+  Fall/depression/audit C negative       Assessment & Plan:      Problem List Items Addressed This Visit      Unprioritized   Benign prostatic hyperplasia   Hypertension    BP improved, he does check at home Would benefit from weight loss, no change to meds Check renal function, lipids      Obese    Work on dietary changes       Relevant Medications   magnesium oxide (MAG-OX) 400 MG tablet    Other Visit Diagnoses    Routine general medical examination at a health care facility    -  Primary   CPE done, PSA screening down, Flu shot given   Relevant Orders   CBC with  Differential/Platelet   Comprehensive metabolic panel   Lipid panel   Need for immunization against influenza       Relevant Orders   Flu Vaccine QUAD 36+ mos IM (Completed)   Screening PSA (prostate specific antigen)       Relevant Orders   PSA      Note: This dictation was prepared with Dragon dictation along with smaller phrase technology. Any transcriptional errors that result from this process are unintentional.

## 2019-07-03 NOTE — Assessment & Plan Note (Signed)
BP improved, he does check at home Would benefit from weight loss, no change to meds Check renal function, lipids

## 2019-07-03 NOTE — Assessment & Plan Note (Signed)
Work on dietary changes 

## 2019-07-03 NOTE — Patient Instructions (Addendum)
F/u 1 YEAR FOR physical  We will call with lab results

## 2019-07-04 LAB — LIPID PANEL
Cholesterol: 168 mg/dL (ref <200)
HDL: 45 mg/dL (ref >=40)
LDL Cholesterol (Calc): 105 mg/dL (calc) — ABNORMAL HIGH
Non-HDL Cholesterol (Calc): 123 mg/dL (calc) (ref <130)
Total CHOL/HDL Ratio: 3.7 (calc) (ref <5.0)
Triglycerides: 87 mg/dL (ref <150)

## 2019-07-04 LAB — CBC WITH DIFFERENTIAL/PLATELET
Absolute Monocytes: 564 cells/uL (ref 200–950)
Basophils Absolute: 61 cells/uL (ref 0–200)
Basophils Relative: 1.3 %
Eosinophils Absolute: 240 cells/uL (ref 15–500)
Eosinophils Relative: 5.1 %
HCT: 44 % (ref 38.5–50.0)
Hemoglobin: 15 g/dL (ref 13.2–17.1)
Lymphs Abs: 1617 cells/uL (ref 850–3900)
MCH: 34.4 pg — ABNORMAL HIGH (ref 27.0–33.0)
MCHC: 34.1 g/dL (ref 32.0–36.0)
MCV: 100.9 fL — ABNORMAL HIGH (ref 80.0–100.0)
MPV: 10.4 fL (ref 7.5–12.5)
Monocytes Relative: 12 %
Neutro Abs: 2218 cells/uL (ref 1500–7800)
Neutrophils Relative %: 47.2 %
Platelets: 205 10*3/uL (ref 140–400)
RBC: 4.36 10*6/uL (ref 4.20–5.80)
RDW: 11.2 % (ref 11.0–15.0)
Total Lymphocyte: 34.4 %
WBC: 4.7 10*3/uL (ref 3.8–10.8)

## 2019-07-04 LAB — COMPREHENSIVE METABOLIC PANEL
AG Ratio: 1.5 (calc) (ref 1.0–2.5)
ALT: 20 U/L (ref 9–46)
AST: 17 U/L (ref 10–40)
Albumin: 4.4 g/dL (ref 3.6–5.1)
Alkaline phosphatase (APISO): 52 U/L (ref 36–130)
BUN: 15 mg/dL (ref 7–25)
CO2: 25 mmol/L (ref 20–32)
Calcium: 9.7 mg/dL (ref 8.6–10.3)
Chloride: 102 mmol/L (ref 98–110)
Creat: 1 mg/dL (ref 0.60–1.35)
Globulin: 3 g/dL (calc) (ref 1.9–3.7)
Glucose, Bld: 90 mg/dL (ref 65–99)
Potassium: 4.3 mmol/L (ref 3.5–5.3)
Sodium: 137 mmol/L (ref 135–146)
Total Bilirubin: 1.1 mg/dL (ref 0.2–1.2)
Total Protein: 7.4 g/dL (ref 6.1–8.1)

## 2019-07-04 LAB — PSA: PSA: 2.8 ng/mL (ref <=4.0)

## 2019-07-05 ENCOUNTER — Encounter: Payer: Self-pay | Admitting: *Deleted

## 2020-08-30 ENCOUNTER — Other Ambulatory Visit: Payer: Self-pay | Admitting: Family Medicine

## 2020-09-02 ENCOUNTER — Other Ambulatory Visit: Payer: Self-pay | Admitting: Family Medicine

## 2020-10-05 ENCOUNTER — Encounter: Payer: BLUE CROSS/BLUE SHIELD | Admitting: Family Medicine

## 2020-10-19 ENCOUNTER — Encounter: Payer: BLUE CROSS/BLUE SHIELD | Admitting: Family Medicine

## 2020-11-03 ENCOUNTER — Encounter: Payer: BLUE CROSS/BLUE SHIELD | Admitting: Family Medicine

## 2020-11-11 ENCOUNTER — Other Ambulatory Visit: Payer: Self-pay

## 2020-11-11 ENCOUNTER — Ambulatory Visit (INDEPENDENT_AMBULATORY_CARE_PROVIDER_SITE_OTHER): Payer: BC Managed Care – PPO | Admitting: Family Medicine

## 2020-11-11 ENCOUNTER — Encounter: Payer: Self-pay | Admitting: Family Medicine

## 2020-11-11 VITALS — BP 144/76 | HR 72 | Temp 98.0°F | Resp 14 | Ht 72.0 in | Wt 239.0 lb

## 2020-11-11 DIAGNOSIS — Z0000 Encounter for general adult medical examination without abnormal findings: Secondary | ICD-10-CM

## 2020-11-11 DIAGNOSIS — Z1159 Encounter for screening for other viral diseases: Secondary | ICD-10-CM | POA: Diagnosis not present

## 2020-11-11 DIAGNOSIS — Z0001 Encounter for general adult medical examination with abnormal findings: Secondary | ICD-10-CM | POA: Diagnosis not present

## 2020-11-11 DIAGNOSIS — E6609 Other obesity due to excess calories: Secondary | ICD-10-CM

## 2020-11-11 DIAGNOSIS — Z125 Encounter for screening for malignant neoplasm of prostate: Secondary | ICD-10-CM | POA: Diagnosis not present

## 2020-11-11 DIAGNOSIS — R040 Epistaxis: Secondary | ICD-10-CM | POA: Diagnosis not present

## 2020-11-11 DIAGNOSIS — Z136 Encounter for screening for cardiovascular disorders: Secondary | ICD-10-CM | POA: Diagnosis not present

## 2020-11-11 DIAGNOSIS — I10 Essential (primary) hypertension: Secondary | ICD-10-CM | POA: Diagnosis not present

## 2020-11-11 DIAGNOSIS — Z13220 Encounter for screening for lipoid disorders: Secondary | ICD-10-CM | POA: Diagnosis not present

## 2020-11-11 DIAGNOSIS — Z6832 Body mass index (BMI) 32.0-32.9, adult: Secondary | ICD-10-CM

## 2020-11-11 NOTE — Patient Instructions (Addendum)
We will call with lab results Try Afrin for nosebleed  F/U 6 months Dr. Tanya Nones

## 2020-11-11 NOTE — Assessment & Plan Note (Signed)
Mildly elevated systolic Discussed dietary changes, weight loss which can improve his BP  Reduce sodium in diet Goal < 140/90 No change to day Pt monitors at home

## 2020-11-11 NOTE — Progress Notes (Signed)
   Subjective:    Patient ID: Mark Bruce, male    DOB: March 29, 1971, 50 y.o.   MRN: [id number]  Patient presents for Annual Exam (Is fasting)   He has had intermittant nosebleeds since he had his first covid shot in march. Had about 3 weeks after 1st and 2nd shot and has had almost monthly since then. No sinus pressure or drainage, states BP has been okay, he feels fine when it occurs , last was Jan 7th    HTN - he checks bp at home Hyzaar / norvasc , runs 130-140/ 70-80's.    He has gained some weight over past year, works as Naval architect, often eats fast food, but has been trying to make healthier choices  no exercise regimen , sits most of day    No difficulty with vision or hearing    Due for PSA screening    Review Of Systems:  GEN- denies fatigue, fever, weight loss,weakness, recent illness HEENT- denies eye drainage, change in vision, nasal discharge, CVS- denies chest pain, palpitations RESP- denies SOB, cough, wheeze ABD- denies N/V, change in stools, abd pain GU- denies dysuria, hematuria, dribbling, incontinence MSK- denies joint pain, muscle aches, injury Neuro- denies headache, dizziness, syncope, seizure activity       Objective:    BP (!) 144/76   Pulse 72   Temp 98 F (36.7 C) (Temporal)   Resp 14   Ht 6' (1.829 m)   Wt 239 lb (108.4 kg)   SpO2 97%   BMI 32.41 kg/m  GEN- NAD, alert and oriented x3 HEENT- PERRL, EOMI, non injected sclera, pink conjunctiva, MMM, oropharynx clear , TM clear no effusion, enlarged turbinate right side, no acute bleed or scab seen in anterior nares, NO sinus tenderness Neck- Supple, no thyromegaly , no LAD  CVS- RRR, no murmur RESP-CTAB ABD-NABS,soft,NT,ND EXT- No edema Pulses- Radial, DP- 2+        Assessment & Plan:      Problem List Items Addressed This Visit      Unprioritized   Hypertension    Mildly elevated systolic Discussed dietary changes, weight loss which can improve his BP  Reduce sodium  in diet Goal < 140/90 No change to day Pt monitors at home      Relevant Orders   Comprehensive metabolic panel   Lipid panel   TSH   Obese    Other Visit Diagnoses    Routine general medical examination at a health care facility    -  Primary   CPE done, fasting labs obtained, discussed HEP C/HIV screen, PSA screen   Relevant Orders   HIV Antibody (routine testing w rflx)   CBC with Differential/Platelet   Comprehensive metabolic panel   Lipid panel   Hepatitis C antibody   Prostate cancer screening       Relevant Orders   PSA   Need for hepatitis C screening test       Relevant Orders   Hepatitis C antibody   Frequent nosebleeds       check clotting labs, possible related to vaccine or underlying sinus issues, may have a posterior vessel bleeding. Advised to use Afrin, next step ENT if labs nnl   Relevant Orders   Protime-INR      Note: This dictation was prepared with Dragon dictation along with smaller phrase technology. Any transcriptional errors that result from this process are unintentional.

## 2020-11-13 LAB — HEPATITIS C ANTIBODY
Hepatitis C Ab: NONREACTIVE
SIGNAL TO CUT-OFF: 0.03 (ref <1.00)

## 2020-11-13 LAB — COMPREHENSIVE METABOLIC PANEL
AG Ratio: 1.7 (calc) (ref 1.0–2.5)
ALT: 24 U/L (ref 9–46)
AST: 17 U/L (ref 10–40)
Albumin: 4.5 g/dL (ref 3.6–5.1)
Alkaline phosphatase (APISO): 56 U/L (ref 36–130)
BUN: 12 mg/dL (ref 7–25)
CO2: 29 mmol/L (ref 20–32)
Calcium: 9.6 mg/dL (ref 8.6–10.3)
Chloride: 99 mmol/L (ref 98–110)
Creat: 0.94 mg/dL (ref 0.60–1.35)
Globulin: 2.7 g/dL (calc) (ref 1.9–3.7)
Glucose, Bld: 92 mg/dL (ref 65–99)
Potassium: 3.8 mmol/L (ref 3.5–5.3)
Sodium: 136 mmol/L (ref 135–146)
Total Bilirubin: 1.1 mg/dL (ref 0.2–1.2)
Total Protein: 7.2 g/dL (ref 6.1–8.1)

## 2020-11-13 LAB — CBC WITH DIFFERENTIAL/PLATELET
Absolute Monocytes: 607 cells/uL (ref 200–950)
Basophils Absolute: 62 cells/uL (ref 0–200)
Basophils Relative: 1.5 %
Eosinophils Absolute: 160 cells/uL (ref 15–500)
Eosinophils Relative: 3.9 %
HCT: 44.8 % (ref 38.5–50.0)
Hemoglobin: 15.5 g/dL (ref 13.2–17.1)
Lymphs Abs: 1386 cells/uL (ref 850–3900)
MCH: 35.3 pg — ABNORMAL HIGH (ref 27.0–33.0)
MCHC: 34.6 g/dL (ref 32.0–36.0)
MCV: 102.1 fL — ABNORMAL HIGH (ref 80.0–100.0)
MPV: 10.6 fL (ref 7.5–12.5)
Monocytes Relative: 14.8 %
Neutro Abs: 1886 cells/uL (ref 1500–7800)
Neutrophils Relative %: 46 %
Platelets: 187 10*3/uL (ref 140–400)
RBC: 4.39 10*6/uL (ref 4.20–5.80)
RDW: 10.8 % — ABNORMAL LOW (ref 11.0–15.0)
Total Lymphocyte: 33.8 %
WBC: 4.1 10*3/uL (ref 3.8–10.8)

## 2020-11-13 LAB — HIV ANTIBODY (ROUTINE TESTING W REFLEX): HIV 1&2 Ab, 4th Generation: NONREACTIVE

## 2020-11-13 LAB — LIPID PANEL
Cholesterol: 168 mg/dL (ref <200)
HDL: 49 mg/dL (ref >=40)
LDL Cholesterol (Calc): 100 mg/dL (calc) — ABNORMAL HIGH
Non-HDL Cholesterol (Calc): 119 mg/dL (calc) (ref <130)
Total CHOL/HDL Ratio: 3.4 (calc) (ref <5.0)
Triglycerides: 92 mg/dL (ref <150)

## 2020-11-13 LAB — FOLATE: Folate: 11.2 ng/mL

## 2020-11-13 LAB — PROTIME-INR
INR: 1
Prothrombin Time: 9.9 s (ref 9.0–11.5)

## 2020-11-13 LAB — TEST AUTHORIZATION

## 2020-11-13 LAB — TSH: TSH: 1.91 mIU/L (ref 0.40–4.50)

## 2020-11-13 LAB — PSA: PSA: 2.3 ng/mL (ref <=4.0)

## 2020-11-13 LAB — VITAMIN B12: Vitamin B-12: 377 pg/mL (ref 200–1100)

## 2020-11-25 ENCOUNTER — Telehealth: Payer: Self-pay | Admitting: Family Medicine

## 2020-11-25 MED ORDER — AMLODIPINE BESYLATE 10 MG PO TABS: 10.0000 mg | ORAL_TABLET | Freq: Every day | ORAL | 0 refills | Status: DC

## 2020-11-25 MED ORDER — LOSARTAN POTASSIUM-HCTZ 100-25 MG PO TABS: 1.0000 | ORAL_TABLET | Freq: Every day | ORAL | 3 refills | Status: DC

## 2020-11-25 MED ORDER — TERAZOSIN HCL 1 MG PO CAPS: 1.0000 mg | ORAL_CAPSULE | Freq: Every day | ORAL | 3 refills | Status: DC

## 2020-11-25 NOTE — Telephone Encounter (Signed)
Prescription sent to pharmacy.

## 2020-11-25 NOTE — Telephone Encounter (Signed)
About to be out can not wait for the mail order Refill Amlodipine sent to [id number] - [place], [place] - [address]-Need refill Hyzaar, Hytrin sent to [facility] - [place], [place] - [address] AT [address] TO [address]

## 2020-12-23 ENCOUNTER — Telehealth: Payer: Self-pay

## 2020-12-23 MED ORDER — AMLODIPINE BESYLATE 10 MG PO TABS: 10.0000 mg | ORAL_TABLET | Freq: Every day | ORAL | 3 refills | Status: DC

## 2020-12-23 NOTE — Telephone Encounter (Signed)
Patient called need med refill amLODipine (NORVASC) 10 MG tablet   [facility]

## 2020-12-23 NOTE — Addendum Note (Signed)
Addended by: Shon Hale on: 12/23/2020 04:54 PM   Modules accepted: Orders

## 2021-02-02 ENCOUNTER — Other Ambulatory Visit: Payer: Self-pay | Admitting: *Deleted

## 2021-02-02 MED ORDER — AMLODIPINE BESYLATE 10 MG PO TABS: 10.0000 mg | ORAL_TABLET | Freq: Every day | ORAL | 3 refills | Status: DC

## 2021-03-02 ENCOUNTER — Telehealth: Payer: Self-pay | Admitting: Family Medicine

## 2021-03-02 MED ORDER — LOSARTAN POTASSIUM-HCTZ 100-25 MG PO TABS: 1.0000 | ORAL_TABLET | Freq: Every day | ORAL | 0 refills | Status: DC

## 2021-03-02 NOTE — Telephone Encounter (Signed)
Pt called stating that he needs a refill for at least 5 day of losartan-hydrochlorothiazide (HYZAAR) 100-25 MG tablet  [facility] [address] His mail order pharmacy will not have his meds to him before he runs out. Please call.  Cb#: [phone]

## 2021-03-02 NOTE — Telephone Encounter (Signed)
Prescription sent to pharmacy.

## 2021-03-18 ENCOUNTER — Other Ambulatory Visit: Payer: Self-pay

## 2021-03-18 ENCOUNTER — Emergency Department (HOSPITAL_COMMUNITY)
Admission: EM | Admit: 2021-03-18 | Discharge: 2021-03-18 | Disposition: A | Discharge status: Home / Self Care | Payer: BC Managed Care – PPO | Attending: Emergency Medicine | Admitting: Emergency Medicine

## 2021-03-18 ENCOUNTER — Encounter (HOSPITAL_COMMUNITY): Payer: Self-pay | Admitting: Emergency Medicine

## 2021-03-18 ENCOUNTER — Telehealth: Payer: Self-pay | Admitting: Cardiology

## 2021-03-18 ENCOUNTER — Emergency Department (HOSPITAL_COMMUNITY): Payer: BC Managed Care – PPO

## 2021-03-18 DIAGNOSIS — Z79899 Other long term (current) drug therapy: Secondary | ICD-10-CM | POA: Diagnosis not present

## 2021-03-18 DIAGNOSIS — I10 Essential (primary) hypertension: Secondary | ICD-10-CM | POA: Diagnosis not present

## 2021-03-18 DIAGNOSIS — R002 Palpitations: Secondary | ICD-10-CM

## 2021-03-18 DIAGNOSIS — R079 Chest pain, unspecified: Secondary | ICD-10-CM | POA: Diagnosis not present

## 2021-03-18 LAB — BASIC METABOLIC PANEL
Anion gap: 8 (ref 5–15)
BUN: 12 mg/dL (ref 6–20)
CO2: 29 mmol/L (ref 22–32)
Calcium: 9.9 mg/dL (ref 8.9–10.3)
Chloride: 104 mmol/L (ref 98–111)
Creatinine, Ser: 0.94 mg/dL (ref 0.61–1.24)
GFR, Estimated: >60 mL/min (ref >60)
Glucose, Bld: 116 mg/dL — ABNORMAL HIGH (ref 70–99)
Potassium: 3.7 mmol/L (ref 3.5–5.1)
Sodium: 141 mmol/L (ref 135–145)

## 2021-03-18 LAB — TSH: TSH: 2.021 u[IU]/mL (ref 0.350–4.500)

## 2021-03-18 LAB — CBC
HCT: 45.4 % (ref 39.0–52.0)
Hemoglobin: 15.7 g/dL (ref 13.0–17.0)
MCH: 35.4 pg — ABNORMAL HIGH (ref 26.0–34.0)
MCHC: 34.6 g/dL (ref 30.0–36.0)
MCV: 102.5 fL — ABNORMAL HIGH (ref 80.0–100.0)
Platelets: 199 10*3/uL (ref 150–400)
RBC: 4.43 MIL/uL (ref 4.22–5.81)
RDW: 11.6 % (ref 11.5–15.5)
WBC: 4.8 10*3/uL (ref 4.0–10.5)
nRBC: 0 % (ref 0.0–0.2)

## 2021-03-18 LAB — TROPONIN I (HIGH SENSITIVITY)
Troponin I (High Sensitivity): 3 ng/L (ref <18)
Troponin I (High Sensitivity): 3 ng/L (ref <18)

## 2021-03-18 LAB — D-DIMER, QUANTITATIVE: D-Dimer, Quant: <0.27 ug/mL-FEU (ref 0.00–0.50)

## 2021-03-18 NOTE — ED Triage Notes (Signed)
Per patient, states he feels like his heart is racing-on and off for 1 weeks-occasional sharp pains, non currently-had testing done years ago and was told he might have an issue with one of his heart valves

## 2021-03-18 NOTE — ED Provider Notes (Signed)
Emergency Medicine Provider Triage Evaluation Note  Mark Bruce , a 50 y.o. male  was evaluated in triage.  Pt complains of heart palpitation.  Review of Systems  Positive: Palpitation, sob Negative: Fever, cough, leg swelling  Physical Exam  BP (!) 157/94 (BP Location: Right Arm)   Pulse (!) 105   Temp 100.3 F (37.9 C) (Oral)   Resp 17   SpO2 96%  Gen:   Awake, no distress   Resp:  Normal effort  MSK:   Moves extremities without difficulty  Other:  tachycardic  Medical Decision Making  Medically screening exam initiated at 1:01 PM.  Appropriate orders placed.  Mark Bruce was informed that the remainder of the evaluation will be completed by another provider, this initial triage assessment does not replace that evaluation, and the importance of remaining in the ED until their evaluation is complete.  Report intermittent heart palpitation along with sob for the past few days.  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     Fayrene Helper, PA-C 03/18/21 1303    Derwood Kaplan, MD 03/19/21 [phone]

## 2021-03-18 NOTE — ED Provider Notes (Signed)
[facility]-EMERGENCY DEPT Provider Note   CSN: [id number] Arrival date & time: 03/18/21  1229     History Chief Complaint  Patient presents with  . feels like heart is racing    Mark Bruce is a 50 y.o. male.  Feeling his chest flutter, heart racing over the last few weeks intermittently randomly, not exertional.  No history of cigarette smoking.  He is a [employer] scientist.  No history of leg swelling.  No recent trauma no recent infection.  No family history of early cardiac disease.  No hyperlipidemia, no diabetes.  No stroke history.  Does have hypertension.  Something is wrong with his aorta per his report from a study he had done in 2000.        Past Medical History:  Diagnosis Date  . Allergy   . Hypertension     Patient Active Problem List   Diagnosis Date Noted  . Obese 12/16/2015  . Benign prostatic hyperplasia 12/16/2015  . History of colonic polyps 09/22/2015  . Allergy   . Hypertension     Past Surgical History:  Procedure Laterality Date  . HERNIA REPAIR  06/07/12   RIH by Dr. Biagio Quint       Family History  Problem Relation Age of Onset  . Hypertension Mother   . Cancer Father        Colon cancer     Social History   Tobacco Use  . Smoking status: Never Smoker  . Smokeless tobacco: Never Used  Substance Use Topics  . Alcohol use: Yes    Alcohol/week: 2.0 standard drinks    Types: 2 Shots of liquor per week  . Drug use: No    Home Medications Prior to Admission medications   Medication Sig Start Date End Date Taking? Authorizing Provider  amLODipine (NORVASC) 10 MG tablet Take 1 tablet (10 mg total) by mouth daily. 02/02/21   Donita Brooks, MD  losartan-hydrochlorothiazide (HYZAAR) 100-25 MG tablet Take 1 tablet by mouth daily. for blood pressure 03/02/21   Donita Brooks, MD  magnesium oxide (MAG-OX) 400 MG tablet Take 400 mg by mouth daily.    [provider]  terazosin (HYTRIN) 1 MG  capsule Take 1 capsule (1 mg total) by mouth at bedtime. 11/25/20   Salley Scarlet, MD    Allergies    Patient has no known allergies.  Review of Systems   Review of Systems  Constitutional: Negative for chills and fever.  HENT: Negative for congestion and rhinorrhea.   Respiratory: Positive for shortness of breath. Negative for cough.   Cardiovascular: Positive for chest pain and palpitations.  Gastrointestinal: Negative for diarrhea, nausea and vomiting.  Genitourinary: Negative for difficulty urinating and dysuria.  Musculoskeletal: Negative for arthralgias and back pain.  Skin: Negative for color change and rash.  Neurological: Negative for light-headedness and headaches.    Physical Exam Updated Vital Signs BP 139/90   Pulse 82   Temp 98 F (36.7 C) (Oral)   Resp 17   Ht 6' (1.829 m)   Wt 108 kg   SpO2 93%   BMI 32.28 kg/m   Physical Exam Vitals and nursing note reviewed.  Constitutional:      General: He is not in acute distress.    Appearance: Normal appearance.  HENT:     Head: Normocephalic and atraumatic.     Nose: No rhinorrhea.  Eyes:     General:  Right eye: No discharge.        Left eye: No discharge.     Conjunctiva/sclera: Conjunctivae normal.  Cardiovascular:     Rate and Rhythm: Normal rate and regular rhythm.  Pulmonary:     Effort: Pulmonary effort is normal. No respiratory distress.     Breath sounds: No stridor. No wheezing.  Abdominal:     General: Abdomen is flat. There is no distension.     Palpations: Abdomen is soft.     Tenderness: There is no abdominal tenderness.  Musculoskeletal:        General: No deformity or signs of injury.  Skin:    General: Skin is warm and dry.  Neurological:     General: No focal deficit present.     Mental Status: He is alert. Mental status is at baseline.     Motor: No weakness.  Psychiatric:        Mood and Affect: Mood normal.        Behavior: Behavior normal.        Thought Content:  Thought content normal.     ED Results / Procedures / Treatments   Labs (all labs ordered are listed, but only abnormal results are displayed) Labs Reviewed  BASIC METABOLIC PANEL - Abnormal; Notable for the following components:      Result Value   Glucose, Bld 116 (*)    All other components within normal limits  CBC - Abnormal; Notable for the following components:   MCV 102.5 (*)    MCH 35.4 (*)    All other components within normal limits  D-DIMER, QUANTITATIVE  TSH  TROPONIN I (HIGH SENSITIVITY)  TROPONIN I (HIGH SENSITIVITY)    EKG EKG Interpretation  Date/Time:  Thursday March 18 2021 12:55:11 EDT Ventricular Rate:  107 PR Interval:  172 QRS Duration: 111 QT Interval:  358 QTC Calculation: 478 R Axis:   -24 Text Interpretation: Sinus tachycardia Ventricular premature complex Consider right atrial enlargement Incomplete RBBB and LAFB Borderline prolonged QT interval 12 Lead; Mason-Likar Confirmed by Cherlynn Perches ([id number]) on 03/18/2021 2:56:26 PM   Radiology DG Chest 2 View  Result Date: 03/18/2021 CLINICAL DATA:  Chest pain EXAM: CHEST - 2 VIEW COMPARISON:  None. FINDINGS: Lungs are clear. Heart size and pulmonary vascularity are normal. No adenopathy. No pneumothorax. No bone lesions. IMPRESSION: Lungs clear.  Heart size normal. Electronically Signed   By: Bretta Bang III M.D.   On: 03/18/2021 13:11    Procedures Procedures   Medications Ordered in ED Medications - No data to display  ED Course  I have reviewed the triage vital signs and the nursing notes.  Pertinent labs & imaging results that were available during my care of the patient were reviewed by me and considered in my medical decision making (see chart for details).    MDM Rules/Calculators/A&P                          Patient has history of rotation, had significant gradients over it.  Is here today with palpitations.  D-dimer negative Trope negative.  EKG shows chronic changes with PVC.  No  acute ischemic change.  Chest x-ray is unremarkable for any acute cardiopulmonary pathology.  Vital signs remained stable.  I spoke to his previous cardiologist who has not seen in over a decade.  They are happy to see him in clinic and agree needs nothing else in the emergency department.  Patient  agrees to discharge outpatient follow-up with return precautions Final Clinical Impression(s) / ED Diagnoses Final diagnoses:  Palpitation    Rx / DC Orders ED Discharge Orders    None       Sabino Lesley, MD 03/18/21 1712

## 2021-03-19 ENCOUNTER — Encounter: Payer: Self-pay | Admitting: Cardiology

## 2021-03-24 NOTE — Telephone Encounter (Signed)
Appointment made

## 2021-04-14 ENCOUNTER — Ambulatory Visit: Payer: Self-pay | Admitting: Cardiology

## 2021-04-15 ENCOUNTER — Ambulatory Visit: Payer: BC Managed Care – PPO | Admitting: Cardiology

## 2021-04-15 ENCOUNTER — Other Ambulatory Visit: Payer: Self-pay

## 2021-04-15 ENCOUNTER — Encounter: Payer: Self-pay | Admitting: Cardiology

## 2021-04-15 VITALS — BP 172/145 | HR 96 | Temp 96.0°F | Resp 17 | Ht 72.0 in | Wt 235.0 lb

## 2021-04-15 DIAGNOSIS — Q251 Coarctation of aorta: Secondary | ICD-10-CM | POA: Diagnosis not present

## 2021-04-15 DIAGNOSIS — R002 Palpitations: Secondary | ICD-10-CM

## 2021-04-15 DIAGNOSIS — Z01812 Encounter for preprocedural laboratory examination: Secondary | ICD-10-CM

## 2021-04-15 DIAGNOSIS — I10 Essential (primary) hypertension: Secondary | ICD-10-CM | POA: Diagnosis not present

## 2021-04-15 DIAGNOSIS — R0989 Other specified symptoms and signs involving the circulatory and respiratory systems: Secondary | ICD-10-CM

## 2021-04-15 MED ORDER — NEBIVOLOL HCL 10 MG PO TABS: 10.0000 mg | ORAL_TABLET | Freq: Every day | ORAL | 2 refills | Status: DC

## 2021-04-15 MED ORDER — ALPRAZOLAM 1 MG PO TABS: 1.0000 mg | ORAL_TABLET | Freq: Once | ORAL | 0 refills | Status: AC

## 2021-04-15 NOTE — Progress Notes (Signed)
Primary Physician/Referring:  Donita Brooks, MD  Patient ID: Mark Bruce, male    DOB: 03/04/1971, 50 y.o.   MRN: [id number]  Chief Complaint  Patient presents with   Tachycardia   Dizziness   New Patient (Initial Visit)    HPI:    Mark Bruce  is a 50 y.o. Male patient with longstanding history of hypertension, and performed thoracic aortogram in view of difficult to control hypertension and suspicion for coarctation of the aorta which had confirmed coarctation.  I had not seen him for this years, he presented to the emergency room on 03/18/2021 with dizziness and palpitations.  He is then referred to me for follow-up and evaluation.  Palpitations described as skipped beats, they happen occasionally when he is resting or reading books or relaxing but now with exertional activity.  On the day of emergency room presentation, he had very frequent episodes and he got anxious about his cardiac condition and wanted to be checked out.  He has not had any significant chest pain although since that time he was told to have coarctation of the aorta he occasionally feels some sharp chest pains on the left side.  No exertional component to this.  Denies any dyspnea, PND or orthopnea.  Past Medical History:  Diagnosis Date   Allergy    Hypertension    Past Surgical History:  Procedure Laterality Date   HERNIA REPAIR  06/07/12   RIH by Dr. Biagio Quint   Family History  Problem Relation Age of Onset   Hypertension Mother    Cancer Father        Colon cancer     Social History   Tobacco Use   Smoking status: Never   Smokeless tobacco: Never  Substance Use Topics   Alcohol use: Yes    Alcohol/week: 2.0 standard drinks    Types: 2 Shots of liquor per week    Comment: OCC   Marital Status: Married  ROS  Review of Systems  Cardiovascular:  Positive for palpitations. Negative for chest pain, dyspnea on exertion and leg swelling.  Gastrointestinal:  Negative for melena.   Objective  Blood pressure (!) 154/79, pulse 77, temperature 98.1 F (36.7 C), temperature source Temporal, resp. rate 17, height 6' (1.829 m), weight 235 lb (106.6 kg), SpO2 96 %. Body mass index is 31.87 kg/m.  Vitals with BMI 04/15/2021 03/18/2021 03/18/2021  Height  - -  Weight 235 lbs - -  BMI 31.86 - -  Systolic 154 139 409  Diastolic 79 90 89  Pulse 77 82 81     Physical Exam Constitutional:      Appearance: He is obese.  Neck:     Vascular: No carotid bruit or JVD.  Cardiovascular:     Rate and Rhythm: Normal rate and regular rhythm.     Pulses: Intact distal pulses.          Radial pulses are 2+ on the right side and 2+ on the left side.       Femoral pulses are 2+ on the right side and 2+ on the left side.      Dorsalis pedis pulses are 2+ on the right side and 2+ on the left side.       Posterior tibial pulses are 2+ on the right side and 2+ on the left side.     Heart sounds: Murmur heard.  Crescendo systolic murmur is present with a grade of 2/6 at the upper left  sternal border.    No gallop.     Comments: Prominent carotid and subclavian arterial pulse evident Pulmonary:     Effort: Pulmonary effort is normal.     Breath sounds: Normal breath sounds.  Abdominal:     General: Bowel sounds are normal.     Palpations: Abdomen is soft.  Musculoskeletal:        General: No swelling.  Skin:    Capillary Refill: Capillary refill takes less than 2 seconds.  Neurological:     General: No focal deficit present.     Laboratory examination:   Recent Labs    11/11/20 1038 03/18/21 1316  NA 136 141  K 3.8 3.7  CL 99 104  CO2 29 29  GLUCOSE 92 116*  BUN 12 12  CREATININE 0.94 0.94  CALCIUM 9.6 9.9  GFRNONAA  --  >60   CrCl cannot be calculated (Patient's most recent lab result is older than the maximum 21 days allowed.).  CMP Latest Ref Rng & Units 03/18/2021 11/11/2020 07/03/2019  Glucose 70 - 99 mg/dL 256(L) 92 90  BUN 6 - 20 mg/dL 12 12 15   Creatinine 0.61  - 1.24 mg/dL 8.93 7.34  Sodium 135 - 145 mmol/L 141 136 137  Potassium 3.5 - 5.1 mmol/L 3.7 3.8 4.3  Chloride 98 - 111 mmol/L 104 99 102  CO2 22 - 32 mmol/L 29 29 25   Calcium 8.9 - 10.3 mg/dL 9.9 9.6 9.7  Total Protein 6.1 - 8.1 g/dL - 7.2 7.4  Total Bilirubin 0.2 - 1.2 mg/dL - 1.1 1.1  Alkaline Phos 40 - 115 U/L - - -  AST 10 - 40 U/L - 17 17  ALT 9 - 46 U/L - 24 20   CBC Latest Ref Rng & Units 03/18/2021 11/11/2020 07/03/2019  WBC 4.0 - 10.5 K/uL 4.8 4.1 4.7  Hemoglobin 13.0 - 17.0 g/dL 11/13/2020 07/05/2019 68.1  Hematocrit 39.0 - 52.0 % 45.4 44.8 44.0  Platelets 150 - 400 K/uL 199 187 205    Lipid Panel Recent Labs    11/11/20 1038  CHOL 168  TRIG 92  LDLCALC 100*  HDL 49  CHOLHDL 3.4   Lipid Panel     Component Value Date/Time   CHOL 168 11/11/2020 1038   TRIG 92 11/11/2020 1038   HDL 49 11/11/2020 1038   CHOLHDL 3.4 11/11/2020 1038   VLDL 17 12/28/2016 0852   LDLCALC 100 (H) 11/11/2020 1038     HEMOGLOBIN A1C Lab Results  Component Value Date   HGBA1C 4.4 06/30/2017   MPG 80 06/30/2017   TSH Recent Labs    11/11/20 1038 03/18/21 1316  TSH 1.91 2.021   Medications and allergies  No Known Allergies    Medication prior to this encounter:   Outpatient Medications Prior to Visit  Medication Sig Dispense Refill   amLODipine (NORVASC) 10 MG tablet Take 1 tablet (10 mg total) by mouth daily. 90 tablet 3   losartan-hydrochlorothiazide (HYZAAR) 100-25 MG tablet Take 1 tablet by mouth daily. for blood pressure 30 tablet 0   magnesium oxide (MAG-OX) 400 MG tablet Take 400 mg by mouth daily.     terazosin (HYTRIN) 1 MG capsule Take 1 capsule (1 mg total) by mouth at bedtime. 90 capsule 3   Turmeric (QC TUMERIC COMPLEX PO) Take by mouth.     No facility-administered medications prior to visit.    FINAL MEDICATION AS OF TODAY:   Medications after current encounter Current Outpatient Medications  Medication Instructions   ALPRAZolam (XANAX) 1 mg, Oral,  Once, Take  1 hour prior to MRI   amLODipine (NORVASC) 10 mg, Oral, Daily   losartan-hydrochlorothiazide (HYZAAR) 100-25 MG tablet 1 tablet, Oral, Daily, for blood pressure   magnesium oxide (MAG-OX) 400 mg, Oral, Daily   nebivolol (BYSTOLIC) 10 mg, Oral, Daily   terazosin (HYTRIN) 1 mg, Oral, Daily at bedtime   Turmeric (QC TUMERIC COMPLEX PO) Oral   Radiology:   MR angiogram of the chest with and without contrast 03/21/2003: THORACIC AORTIC COARCTATION IS PRESENT.  THERE IS A MARKED NARROWING OF THE THORACIC AORTA JUST  DISTAL TO THE TAKEOFF OF THE LEFT SUBCLAVIAN ARTERY.    DIAMETER OF THE ASCENDING AORTA IS 31 MM.  AT THE TAKEOFF OF THE INNOMINATE, IT IS 27 MM IN CALIBER.  AT THE TAKEOFF OF THE LEFT SUBCLAVIAN ARTERY, THE DIAMETER IS 18 MM.  JUST DISTAL TO THE TAKEOFF OF THE LEFT SUBCLAVIAN ARTERY AT THE  COARCTATION, THE CALIBER NARROWS TO APPROXIMATELY 8 MM AND IS WAIST-LIKE.  JUST DISTAL TO THE COARCT, THERE IS POSTSTENOTIC DILATATION AND THE CALIBER OF THE DESCENDING THORACIC AORTA IS 31 MM.   MARKED HYPERTROPHY OF THE RIGHT INTERNAL MAMMARY ARTERY AS WELL AS INTERCOSTAL VESSELS ARE SEEN PROVIDING COLLATERAL FLOW PROXIMAL TO THE COARCTATION TO THE DESCENDING AORTA DISTAL TO THE COARCTATION.  Cardiac Studies:    Thoracic aortogram 03/03/2003:  1. The ascending aortic pressures were 140/87 with a mean of 110 mmHg.  2. The descending thoracic aortic pressures were 99/82 with a mean of 91 mmHg.  3. There was a pressure gradient of 45 mmHg between the descending abdominal     aorta and the arch of the aorta.  This was highly suspicious for coarctation.    ANGIOGRAPHIC DATA:  1. Abdominal aortogram:  The abdominal aortogram revealed widely patent     abdominal aorta.  There were two renal arteries, one on either side, and     they were widely patent.  The aortoiliac bifurcation was widely patent.     The iliac arteries were normal.  2. Ascending aorta and arch of the aorta:  The ascending aorta  was normal.     There was  no evidence of aortic valve regurgitation and no evidence of     aortic dissection.  The right innominate artery was normal, and the left     subclavian artery was normal.  Just  off the origin of the left     subclavian artery there was coarctation of the aorta noted, which was     very focal.    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  This is hemodynamically significant  with a pressure gradient of 50 mmHg across the coarctation.    RECOMMENDATIONS:  The patient will proceed with evaluation of the  coarctation of the aorta by MRA.  This will also help in determining whether  he is a candidate for correction either by percutaneous or by surgical  route.  Further recommendations will follow. EKG:   EKG 04/15/2021: Normal sinus rhythm at rate of 61 bpm, left atrial enlargement, left axis deviation, left anterior fascicular block.  Incomplete right bundle branch block.  LVH.  Single PVC.   EKG 03/18/2021: Normal sinus rhythm/sinus tachycardia at rate of 111 bpm, left atrial enlargement, left axis deviation, left intrafascicular block.  Incomplete right bundle branch block.  Poor R wave  progression, cannot exclude anteroseptal infarct old.  Single PVC.  Assessment     ICD-10-CM   1. Coarctation of the aorta, complex  Q25.1 nebivolol (BYSTOLIC) 10 MG tablet    PCV ECHOCARDIOGRAM COMPLETE    MR CHEST W WO CONTRAST    PCV ANKLE BRACHIAL INDEX (ABI)    CANCELED: CT ANGIO CHEST AORTA W/CM & OR WO/CM    2. Primary hypertension  I10 EKG 12-Lead    nebivolol (BYSTOLIC) 10 MG tablet    3. Other specified symptoms and signs involving the circulatory and respiratory systems  R09.89 MR CHEST W WO CONTRAST    4. Pre-procedure lab exam  Z01.812 ALPRAZolam (XANAX) 1 MG tablet    5. Palpitations  R00.2        There are no discontinued medications.  Meds ordered this encounter  Medications    nebivolol (BYSTOLIC) 10 MG tablet    Sig: Take 1 tablet (10 mg total) by mouth daily.    Dispense:  30 tablet    Refill:  2   ALPRAZolam (XANAX) 1 MG tablet    Sig: Take 1 tablet (1 mg total) by mouth once for 1 dose. Take 1 hour prior to MRI    Dispense:  1 tablet    Refill:  0    Orders Placed This Encounter  Procedures   MR CHEST W WO CONTRAST    Standing Status:   Future    Standing Expiration Date:   04/15/2022    Order Specific Question:   If indicated for the ordered procedure, I authorize the administration of contrast media per Radiology protocol    Answer:   Yes    Order Specific Question:   What is the patient's sedation requirement?    Answer:   Anti-anxiety    Order Specific Question:   Does the patient have a pacemaker or implanted devices?    Answer:   No    Order Specific Question:   Call Results- Best Contact Number?    Answer:   [phone]    Order Specific Question:   Preferred imaging location?    Answer:   [facility] (table limit-550lbs)   EKG 12-Lead   PCV ECHOCARDIOGRAM COMPLETE    Standing Status:   Future    Standing Expiration Date:   04/15/2022    Recommendations:   JAGER KOSKA is a 50 y.o. Male patient with longstanding history of hypertension, and performed thoracic aortogram in view of difficult to control hypertension and suspicion for coarctation of the aorta which had confirmed coarctation.  I had not seen him for this years, he presented to the emergency room on 03/18/2021 with dizziness and palpitations.  He is then referred to me for follow-up and evaluation.  Palpitations described as skipped beats, they happen occasionally when he is resting or reading books or relaxing but now with exertional activity.  On the day of emergency room presentation, he had very frequent episodes and he got anxious about his cardiac condition and wanted to be checked out.  His symptoms of palpitations is suggestive of PACs and PVCs.  He did have a  PVC on the EKG but was asymptomatic during examination.  With regard to hypertension, I would like to start him on a beta-blocker therapy.  In view of his young age, we will try Bystolic without any side effects to sexual dysfunction.  In spite of coarctation of the aorta that was previously proven, he is tolerating high-dose losartan HCT without  adverse renal complication.  Continue both amlodipine and losartan HCT for now.  He will need an echocardiogram to evaluate aortopathy in view of coarctation of the aorta.  I will also obtain MRI of the aorta to follow-up on poststenotic dilatation and aneurysm formation.  He does have prominent subclavian and carotid pulses.  I will also obtain ABI.  Although blood pressure was checked with automatic blood pressure apparatus, there was no blood pressure differential between upper extremities.  Low blood pressure differential between upper and lower extremity.  Reviewed his labs, within normal limits, lipids are also normal.  I have discussed with him regarding weight loss.  I would like to see him back in 6 weeks for follow-up.  This was a >60 minutes of patient visit with evaluation of his old records, reconciliation of old records and complex decision making.   Yates Decamp, MD, [facility] 04/15/2021, 12:58 PM Office: [phone]

## 2021-04-21 ENCOUNTER — Ambulatory Visit: Payer: BC Managed Care – PPO

## 2021-04-21 ENCOUNTER — Other Ambulatory Visit: Payer: Self-pay

## 2021-04-21 DIAGNOSIS — Q251 Coarctation of aorta: Secondary | ICD-10-CM

## 2021-04-21 DIAGNOSIS — R0989 Other specified symptoms and signs involving the circulatory and respiratory systems: Secondary | ICD-10-CM | POA: Diagnosis not present

## 2021-04-29 NOTE — Progress Notes (Signed)
ABI 04/21/2021: This exam reveals moderately decreased perfusion of the right lower extremity, noted at the post tibial artery level (ABI 0.64) and moderately decreased perfusion of the left lower extremity, noted at the post tibial artery level (ABI 0.61).

## 2021-04-29 NOTE — Progress Notes (Signed)
Echocardiogram 04/21/2021: Left ventricle cavity is normal in size. Moderate concentric hypertrophy of the left ventricle. Normal global wall motion. Normal LV systolic function with EF 55%. Doppler evidence of grade I (impaired) diastolic dysfunction, normal LAP. Structurally normal trileaflet aortic valve. No evidence of aortic stenosis. Mild (Grade I) aortic regurgitation. Mild (Grade I) mitral regurgitation. Mild tricuspid regurgitation. No evidence of pulmonary hypertension. Coarcted portion of aorta not visualized on this study.

## 2021-05-08 ENCOUNTER — Ambulatory Visit
Admission: RE | Admit: 2021-05-08 | Discharge: 2021-05-08 | Disposition: A | Discharge status: Home / Self Care | Payer: BLUE CROSS/BLUE SHIELD | Source: Ambulatory Visit | Attending: Cardiology | Admitting: Cardiology

## 2021-05-08 ENCOUNTER — Other Ambulatory Visit: Payer: Self-pay

## 2021-05-08 DIAGNOSIS — Q251 Coarctation of aorta: Secondary | ICD-10-CM

## 2021-05-08 DIAGNOSIS — I712 Thoracic aortic aneurysm, without rupture: Secondary | ICD-10-CM | POA: Diagnosis not present

## 2021-05-08 DIAGNOSIS — R0989 Other specified symptoms and signs involving the circulatory and respiratory systems: Secondary | ICD-10-CM

## 2021-05-08 DIAGNOSIS — I350 Nonrheumatic aortic (valve) stenosis: Secondary | ICD-10-CM | POA: Diagnosis not present

## 2021-05-08 DIAGNOSIS — I7789 Other specified disorders of arteries and arterioles: Secondary | ICD-10-CM | POA: Diagnosis not present

## 2021-05-08 MED ORDER — GADOBENATE DIMEGLUMINE 529 MG/ML IV SOLN
20.0000 mL | Freq: Once | INTRAVENOUS | Status: AC | PRN
  Administered 2021-05-08: 20 mL via INTRAVENOUS

## 2021-05-10 NOTE — Progress Notes (Signed)
MRI of the chest with and without contrast 05/10/2021: Comparison report from 03/19/2003, no images available. MR demonstrates changes of aortic coarctation of the distal thoracic arch/proximal descending thoracic aorta, just beyond the left subclavian artery origin. The resolution of the MR angiogram limits the specificity of the exam regarding a true cross-sectional diameter, however, the sagittal sequence 13 demonstrates a channel which is estimated 5 mm-6 mm, with evidence of a circumferential web at the site of coarctation.  Impression: MR angiogram positive for aortic coarctation, with expected changes of mild post-stenotic aortic dilation at 31 mm, collateral arterialization (Collateral vasculature formation of the mediastinum (bronchial/segmental arteries) and of the superficial thorax. 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 No aortic aneurysm.

## 2021-05-27 ENCOUNTER — Encounter: Payer: Self-pay | Admitting: Cardiology

## 2021-05-27 ENCOUNTER — Other Ambulatory Visit: Payer: Self-pay

## 2021-05-27 ENCOUNTER — Ambulatory Visit: Payer: BC Managed Care – PPO | Admitting: Cardiology

## 2021-05-27 VITALS — BP 158/75 | HR 78 | Temp 98.0°F | Ht 72.0 in | Wt 227.0 lb

## 2021-05-27 DIAGNOSIS — E6609 Other obesity due to excess calories: Secondary | ICD-10-CM

## 2021-05-27 DIAGNOSIS — Q251 Coarctation of aorta: Secondary | ICD-10-CM | POA: Diagnosis not present

## 2021-05-27 DIAGNOSIS — Z6830 Body mass index (BMI) 30.0-30.9, adult: Secondary | ICD-10-CM | POA: Diagnosis not present

## 2021-05-27 DIAGNOSIS — I10 Essential (primary) hypertension: Secondary | ICD-10-CM | POA: Diagnosis not present

## 2021-05-27 MED ORDER — NEBIVOLOL HCL 20 MG PO TABS: 20.0000 mg | ORAL_TABLET | Freq: Every day | ORAL | 3 refills | Status: DC

## 2021-05-27 NOTE — Progress Notes (Signed)
Primary Physician/Referring:  Donita Brooks, MD  Patient ID: Mark Bruce, male    DOB: 1971/02/21, 50 y.o.   MRN: [id number]  No chief complaint on file.   HPI:    Mark Bruce  is a 50 y.o. Male patient with longstanding history of hypertension,  coarctation of the aorta since 2004, lost to follow-up and he presented to the emergency room on 03/18/2021 with dizziness and palpitations.  He is then referred to me for follow-up and evaluation.  He now presents for follow-up at 6 week. He has not had any significant chest pain although since that time he was told to have coarctation of the aorta he occasionally feels some sharp chest pains on the left side.  No exertional component to this.  Denies any dyspnea, PND or orthopnea.  I will start him on Bystolic 10 mg daily, which he is tolerating.  Past Medical History:  Diagnosis Date   Allergy    Hypertension    Past Surgical History:  Procedure Laterality Date   HERNIA REPAIR  06/07/12   RIH by Dr. Biagio Quint   Family History  Problem Relation Age of Onset   Hypertension Mother    Cancer Father        Colon cancer     Social History   Tobacco Use   Smoking status: Never   Smokeless tobacco: Never  Substance Use Topics   Alcohol use: Yes    Alcohol/week: 2.0 standard drinks    Types: 2 Shots of liquor per week    Comment: OCC   Marital Status: Married  ROS  Review of Systems  Cardiovascular:  Negative for chest pain, dyspnea on exertion and leg swelling.  Gastrointestinal:  Negative for melena.  Objective  Blood pressure (!) 158/75, pulse 78, temperature 98 F (36.7 C), height 6' (1.829 m), weight 227 lb (103 kg), SpO2 98 %. Body mass index is 30.79 kg/m.  Vitals with BMI 05/27/2021 04/15/2021 04/15/2021  Height  - -  Weight 227 lbs - -  BMI 30.78 - -  Systolic 158 172 409  Diastolic 75 145 93  Pulse 78 96 71     Physical Exam Constitutional:      Appearance: He is obese.  Neck:     Vascular:  No carotid bruit or JVD.  Cardiovascular:     Rate and Rhythm: Normal rate and regular rhythm.     Pulses: Intact distal pulses.          Radial pulses are 2+ on the right side and 2+ on the left side.       Femoral pulses are 2+ on the right side and 2+ on the left side.      Dorsalis pedis pulses are 2+ on the right side and 2+ on the left side.       Posterior tibial pulses are 2+ on the right side and 2+ on the left side.     Heart sounds: Murmur heard.  Crescendo systolic murmur is present with a grade of 2/6 at the upper left sternal border.    No gallop.     Comments: Prominent carotid and subclavian arterial pulse evident Pulmonary:     Effort: Pulmonary effort is normal.     Breath sounds: Normal breath sounds.  Abdominal:     General: Bowel sounds are normal.     Palpations: Abdomen is soft.  Musculoskeletal:        General: No swelling.  Skin:  Capillary Refill: Capillary refill takes less than 2 seconds.  Neurological:     General: No focal deficit present.     Laboratory examination:   Recent Labs    11/11/20 1038 03/18/21 1316  NA 136 141  K 3.8 3.7  CL 99 104  CO2 29 29  GLUCOSE 92 116*  BUN 12 12  CREATININE 0.94 0.94  CALCIUM 9.6 9.9  GFRNONAA  --  >60   CrCl cannot be calculated (Patient's most recent lab result is older than the maximum 21 days allowed.).  CMP Latest Ref Rng & Units 03/18/2021 11/11/2020 07/03/2019  Glucose 70 - 99 mg/dL 443(X) 92 90  BUN 6 - 20 mg/dL 12 12 15   Creatinine 0.61 - 1.24 mg/dL 5.40 0.86  Sodium 135 - 145 mmol/L 141 136 137  Potassium 3.5 - 5.1 mmol/L 3.7 3.8 4.3  Chloride 98 - 111 mmol/L 104 99 102  CO2 22 - 32 mmol/L 29 29 25   Calcium 8.9 - 10.3 mg/dL 9.9 9.6 9.7  Total Protein 6.1 - 8.1 g/dL - 7.2 7.4  Total Bilirubin 0.2 - 1.2 mg/dL - 1.1 1.1  Alkaline Phos 40 - 115 U/L - - -  AST 10 - 40 U/L - 17 17  ALT 9 - 46 U/L - 24 20   CBC Latest Ref Rng & Units 03/18/2021 11/11/2020 07/03/2019  WBC 4.0 - 10.5 K/uL  4.8 4.1 4.7  Hemoglobin 13.0 - 17.0 g/dL 11/13/2020 07/05/2019 95.0  Hematocrit 39.0 - 52.0 % 45.4 44.8 44.0  Platelets 150 - 400 K/uL 199 187 205    Lipid Panel Recent Labs    11/11/20 1038  CHOL 168  TRIG 92  LDLCALC 100*  HDL 49  CHOLHDL 3.4   Lipid Panel     Component Value Date/Time   CHOL 168 11/11/2020 1038   TRIG 92 11/11/2020 1038   HDL 49 11/11/2020 1038   CHOLHDL 3.4 11/11/2020 1038   VLDL 17 12/28/2016 0852   LDLCALC 100 (H) 11/11/2020 1038     HEMOGLOBIN A1C Lab Results  Component Value Date   HGBA1C 4.4 06/30/2017   MPG 80 06/30/2017   TSH Recent Labs    11/11/20 1038 03/18/21 1316  TSH 1.91 2.021   Medications and allergies  No Known Allergies    Medication prior to this encounter:   Outpatient Medications Prior to Visit  Medication Sig Dispense Refill   amLODipine (NORVASC) 10 MG tablet Take 1 tablet (10 mg total) by mouth daily. 90 tablet 3   losartan-hydrochlorothiazide (HYZAAR) 100-25 MG tablet Take 1 tablet by mouth daily. for blood pressure 30 tablet 0   magnesium oxide (MAG-OX) 400 MG tablet Take 400 mg by mouth daily.     terazosin (HYTRIN) 1 MG capsule Take 1 capsule (1 mg total) by mouth at bedtime. 90 capsule 3   Turmeric (QC TUMERIC COMPLEX PO) Take by mouth.     nebivolol (BYSTOLIC) 10 MG tablet Take 1 tablet (10 mg total) by mouth daily. 30 tablet 2   No facility-administered medications prior to visit.    FINAL MEDICATION AS OF TODAY:   Medications after current encounter Current Outpatient Medications  Medication Instructions   amLODipine (NORVASC) 10 mg, Oral, Daily   losartan-hydrochlorothiazide (HYZAAR) 100-25 MG tablet 1 tablet, Oral, Daily, for blood pressure   magnesium oxide (MAG-OX) 400 mg, Oral, Daily   Nebivolol HCl (BYSTOLIC) 20 mg, Oral, Daily   terazosin (HYTRIN) 1 mg, Oral, Daily at bedtime   Turmeric (  QC TUMERIC COMPLEX PO) Oral   Radiology:   MR angiogram of the chest with and without contrast  03/21/2003: THORACIC AORTIC COARCTATION IS PRESENT.  THERE IS A MARKED NARROWING OF THE THORACIC AORTA JUST  DISTAL TO THE TAKEOFF OF THE LEFT SUBCLAVIAN ARTERY.    DIAMETER OF THE ASCENDING AORTA IS 31 MM.  AT THE TAKEOFF OF THE INNOMINATE, IT IS 27 MM IN CALIBER.  AT THE TAKEOFF OF THE LEFT SUBCLAVIAN ARTERY, THE DIAMETER IS 18 MM.  JUST DISTAL TO THE TAKEOFF OF THE LEFT SUBCLAVIAN ARTERY AT THE  COARCTATION, THE CALIBER NARROWS TO APPROXIMATELY 8 MM AND IS WAIST-LIKE.  JUST DISTAL TO THE COARCT, THERE IS POSTSTENOTIC DILATATION AND THE CALIBER OF THE DESCENDING THORACIC AORTA IS 31 MM.   MARKED HYPERTROPHY OF THE RIGHT INTERNAL MAMMARY ARTERY AS WELL AS INTERCOSTAL VESSELS ARE SEEN PROVIDING COLLATERAL FLOW PROXIMAL TO THE COARCTATION TO THE DESCENDING AORTA DISTAL TO THE COARCTATION.  MRI of the chest with and without contrast 05/10/2021: Comparison report from 03/19/2003, no images available. MR demonstrates changes of aortic coarctation of the distal thoracic arch/proximal descending thoracic aorta, just beyond the left subclavian artery origin. The resolution of the MR angiogram limits the specificity of the exam regarding a true cross-sectional diameter, however, the sagittal sequence 13 demonstrates a channel which is estimated 5 mm-6 mm, with evidence of a circumferential web at the site of coarctation.   Impression: MR angiogram positive for aortic coarctation, with expected changes of mild post-stenotic aortic dilation at 31 mm, collateral arterialization (Collateral vasculature formation of the mediastinum (bronchial/segmental arteries) and of the superficial thorax. 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 No aortic aneurysm.   Cardiac Studies:   Thoracic aortogram 03/03/2003:  1. The ascending aortic pressures were 140/87 with a mean of 110 mmHg.  2. The descending thoracic aortic pressures were 99/82 with a mean  of 91 mmHg.  3. There was a pressure gradient of 45 mmHg between the descending abdominal     aorta and the arch of the aorta.  This was highly suspicious for coarctation.  ANGIOGRAPHIC DATA:  1. Abdominal aortogram:  The abdominal aortogram revealed widely patent     abdominal aorta.  There were two renal arteries, one on either side, and     they were widely patent.  The aortoiliac bifurcation was widely patent.     The iliac arteries were normal.  2. Ascending aorta and arch of the aorta:  The ascending aorta was normal.     There was  no evidence of aortic valve regurgitation and no evidence of     aortic dissection.  The right innominate artery was normal, and the left     subclavian artery was normal.  Just  off the origin of the left     subclavian artery there was coarctation of the aorta noted, which was     very focal.    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  This is hemodynamically significant  with a pressure gradient of 50 mmHg across the coarctation.    RECOMMENDATIONS:  The patient will proceed with evaluation of the  coarctation of the aorta by MRA.  This will also help in determining whether  he is a candidate for correction either by percutaneous or by surgical  route.  Further recommendations will follow.  Echocardiogram 04/21/2021:  Left ventricle cavity  is normal in size. Moderate concentric hypertrophy  of the left ventricle. Normal global wall motion. Normal LV systolic  function with EF 55%. Doppler evidence of grade I (impaired) diastolic  dysfunction, normal LAP.  Structurally normal trileaflet aortic valve. No evidence of aortic  stenosis. Mild (Grade I) aortic regurgitation.  Mild (Grade I) mitral regurgitation.  Mild tricuspid regurgitation.  No evidence of pulmonary hypertension.  Coarcted portion of aorta not visualized on this study.  ABI 04/21/2021:  This exam reveals  moderately decreased perfusion of the right lower  extremity, noted at the post tibial artery level (ABI 0.64) and moderately  decreased perfusion of the left lower extremity, noted at the post tibial  artery level (ABI 0.61).  EKG:   EKG 04/15/2021: Normal sinus rhythm at rate of 61 bpm, left atrial enlargement, left axis deviation, left anterior fascicular block.  Incomplete right bundle branch block.  LVH.  Single PVC.   EKG 03/18/2021: Normal sinus rhythm/sinus tachycardia at rate of 111 bpm, left atrial enlargement, left axis deviation, left intrafascicular block.  Incomplete right bundle branch block.  Poor R wave progression, cannot exclude anteroseptal infarct old.  Single PVC.  Assessment     ICD-10-CM   1. Coarctation of the aorta, complex  Q25.1 Nebivolol HCl (BYSTOLIC) 20 MG TABS    Ambulatory referral to Cardiothoracic Surgery    2. Primary hypertension  I10 Nebivolol HCl (BYSTOLIC) 20 MG TABS    3. Class 1 obesity due to excess calories with serious comorbidity and body mass index (BMI) of 30.0 to 30.9 in adult  E66.09    Z68.30        Medications Discontinued During This Encounter  Medication Reason   nebivolol (BYSTOLIC) 10 MG tablet     Meds ordered this encounter  Medications   Nebivolol HCl (BYSTOLIC) 20 MG TABS    Sig: Take 1 tablet (20 mg total) by mouth daily.    Dispense:  90 tablet    Refill:  3    Orders Placed This Encounter  Procedures   Ambulatory referral to Cardiothoracic Surgery    Referral Priority:   Routine    Referral Type:   Surgical    Referral Reason:   Specialty Services Required    Referred to Provider:   Nanetta Batty, MD    Requested Specialty:   Cardiothoracic Surgery    Number of Visits Requested:   1   Recommendations:   Mark Bruce is a 51 y.o. Male patient with longstanding history of hypertension,  coarctation of the aorta since 2004, lost to follow-up and he presented to the emergency room on 03/18/2021 with  dizziness and palpitations.  He is then referred to me for follow-up and evaluation.  He now presents for follow-up at 6 weeks, I reviewed the results of the MRI of his aorta and also echocardiogram.  Labs reviewed.  He is tolerating Bystolic, will increase to 20 mg to control blood pressure better.  He is already on an ARB.  He is also on amlodipine.  Lipids are well controlled, hyperglycemia discussed and weight loss discussed.  With regard to coarctation of the aorta ( THE CALIBER NARROWS TO APPROXIMATELY 8 MM AND IS WAIST-LIKE), I again had an extensive discussion with the patient regarding progression of aortopathy, leading to significant issues with carotid disease, aortic dissections, aortic aneurysm and continued difficulty in controlling hypertension and hypertensive heart disease.  In a similar fashion I had performed my evaluation in 2004, patient  was skeptical of having any surgical procedures, after long discussions and illustration with fingers and diagrams, he is now willing to have a second opinion.  [facility] by Dr. Italyhad Hughes.  Weight loss discussed.   I would like to see him back in 3 months for follow-up.  This was a 40-minute office visit encounter.   CC: Italyhad Hughes, MD [facility]    Yates DecampJay Chriss Redel, MD, [facility] 05/27/2021, 10:11 PM Office: [phone] Fax: [phone] Pager: [phone]

## 2021-07-10 ENCOUNTER — Other Ambulatory Visit: Payer: Self-pay | Admitting: Cardiology

## 2021-07-10 DIAGNOSIS — I10 Essential (primary) hypertension: Secondary | ICD-10-CM

## 2021-07-10 DIAGNOSIS — Q251 Coarctation of aorta: Secondary | ICD-10-CM

## 2021-07-14 ENCOUNTER — Telehealth: Payer: Self-pay | Admitting: Family Medicine

## 2021-07-14 NOTE — Telephone Encounter (Signed)
Patient left voicemail message to request call back from [place]; stated [facility] won't refill his meds. Please advise at [phone].

## 2021-07-15 NOTE — Telephone Encounter (Signed)
Returned call to patient , he was not sure which medication he needed, however will call back to inform us which refills are being requested, then route back to provider for review.

## 2021-07-16 ENCOUNTER — Other Ambulatory Visit: Payer: Self-pay

## 2021-07-16 DIAGNOSIS — I10 Essential (primary) hypertension: Secondary | ICD-10-CM

## 2021-07-16 DIAGNOSIS — Q251 Coarctation of aorta: Secondary | ICD-10-CM

## 2021-07-16 MED ORDER — NEBIVOLOL HCL 20 MG PO TABS: 20.0000 mg | ORAL_TABLET | Freq: Every day | ORAL | 3 refills | Status: DC

## 2021-07-16 NOTE — Telephone Encounter (Signed)
Patient called requesting Ne

## 2021-07-22 ENCOUNTER — Encounter: Payer: Self-pay | Admitting: Family Medicine

## 2021-07-22 ENCOUNTER — Ambulatory Visit (INDEPENDENT_AMBULATORY_CARE_PROVIDER_SITE_OTHER): Payer: BC Managed Care – PPO | Admitting: Family Medicine

## 2021-07-22 ENCOUNTER — Other Ambulatory Visit: Payer: Self-pay

## 2021-07-22 DIAGNOSIS — Q251 Coarctation of aorta: Secondary | ICD-10-CM

## 2021-07-22 DIAGNOSIS — I10 Essential (primary) hypertension: Secondary | ICD-10-CM | POA: Diagnosis not present

## 2021-07-22 MED ORDER — NEBIVOLOL HCL 20 MG PO TABS: 20.0000 mg | ORAL_TABLET | Freq: Every day | ORAL | 3 refills | Status: DC

## 2021-07-22 MED ORDER — LOSARTAN POTASSIUM-HCTZ 100-25 MG PO TABS: 1.0000 | ORAL_TABLET | Freq: Every day | ORAL | 0 refills | Status: DC

## 2021-07-22 MED ORDER — TERAZOSIN HCL 1 MG PO CAPS: 1.0000 mg | ORAL_CAPSULE | Freq: Every day | ORAL | 3 refills | Status: DC

## 2021-07-22 MED ORDER — AMLODIPINE BESYLATE 10 MG PO TABS: 10.0000 mg | ORAL_TABLET | Freq: Every day | ORAL | 3 refills | Status: DC

## 2021-07-22 NOTE — Progress Notes (Signed)
Subjective:    Patient ID: Mark Bruce, male    DOB: 02-02-1971, 50 y.o.   MRN: [id number]  HPI  Patient is a very pleasant 50 year old gentleman here today to establish care with me.  Our entire visit today was primarily spent in discussion regarding his blood pressure.  He has a history of hypertension that has been difficult to control.  He is currently on losartan hydrochlorothiazide, amlodipine, Bystolic, and terazosin.  Despite that, his blood pressure is 148/84.  However the patient mentions that he has an abnormality with his heart.  Reviewing his cardiac MRI shows that he has a coarctation of the thoracic aorta which is likely causing elevated pressures in his upper extremities and decreased blood pressure in his lower extremities.  He is scheduled to meet with a vascular surgeon at [facility] later this month to discuss his options for repair.  He denies any chest pain shortness of breath or dyspnea on exertion.  Also reviewed his echocardiogram of his heart.  He has trace tricuspid and mitral regurgitation as well as aortic regurgitation.  He does have moderate concentric hypertrophy likely due to longstanding hypertension.  Otherwise his echocardiogram was normal Past Medical History:  Diagnosis Date   Allergy    Hypertension    Past Surgical History:  Procedure Laterality Date   HERNIA REPAIR  06/07/12   RIH by Dr. Biagio Quint   Current Outpatient Medications on File Prior to Visit  Medication Sig Dispense Refill   magnesium oxide (MAG-OX) 400 MG tablet Take 400 mg by mouth daily.     Turmeric (QC TUMERIC COMPLEX PO) Take by mouth.     No current facility-administered medications on file prior to visit.   Marland Kitchenall Social History   Socioeconomic History   Marital status: Married    Spouse name: Not on file   Number of children: 1   Years of education: Not on file   Highest education level: Not on file  Occupational History   Not on file  Tobacco Use   Smoking status: Never    Smokeless tobacco: Never  Vaping Use   Vaping Use: Never used  Substance and Sexual Activity   Alcohol use: Yes    Alcohol/week: 2.0 standard drinks    Types: 2 Shots of liquor per week    Comment: OCC   Drug use: No   Sexual activity: Yes  Other Topics Concern   Not on file  Social History Narrative   Not on file   Social Determinants of Health   Financial Resource Strain: Not on file  Food Insecurity: Not on file  Transportation Needs: Not on file  Physical Activity: Not on file  Stress: Not on file  Social Connections: Not on file  Intimate Partner Violence: Not on file     Review of Systems  All other systems reviewed and are negative.     Objective:   Physical Exam Vitals reviewed.  Constitutional:      Appearance: Normal appearance. He is normal weight.  Cardiovascular:     Rate and Rhythm: Normal rate and regular rhythm.     Heart sounds: Normal heart sounds.  Pulmonary:     Effort: Pulmonary effort is normal.     Breath sounds: Normal breath sounds.  Neurological:     Mental Status: He is alert.          Assessment & Plan:   Coarctation of the aorta, complex - Plan: Nebivolol HCl (BYSTOLIC) 20 MG TABS  Primary hypertension - Plan: Nebivolol HCl (BYSTOLIC) 20 MG TABS We spent the majority of our 15-minute encounter today discussing his blood pressure, along with his other medical condition.  I will refill his blood pressure medication.  I will allow permissive hypertension as long as his systolic blood pressure is not higher than 150 until he meets with the vascular surgeon.  Recently had a BMP in June that was normal.  Cholesterol was normal in January

## 2021-09-03 ENCOUNTER — Ambulatory Visit: Payer: BC Managed Care – PPO | Admitting: Cardiology

## 2021-09-07 ENCOUNTER — Other Ambulatory Visit: Payer: Self-pay | Admitting: Family Medicine

## 2021-09-07 DIAGNOSIS — Q251 Coarctation of aorta: Secondary | ICD-10-CM | POA: Diagnosis not present

## 2021-09-07 DIAGNOSIS — R918 Other nonspecific abnormal finding of lung field: Secondary | ICD-10-CM | POA: Diagnosis not present

## 2021-09-07 DIAGNOSIS — Z01818 Encounter for other preprocedural examination: Secondary | ICD-10-CM | POA: Diagnosis not present

## 2021-11-01 DIAGNOSIS — I10 Essential (primary) hypertension: Secondary | ICD-10-CM | POA: Diagnosis not present

## 2021-11-01 DIAGNOSIS — Q231 Congenital insufficiency of aortic valve: Secondary | ICD-10-CM | POA: Insufficient documentation

## 2021-11-01 DIAGNOSIS — I7123 Aneurysm of the descending thoracic aorta, without rupture: Secondary | ICD-10-CM | POA: Diagnosis not present

## 2021-11-01 DIAGNOSIS — Z7982 Long term (current) use of aspirin: Secondary | ICD-10-CM | POA: Diagnosis not present

## 2021-11-01 DIAGNOSIS — Q251 Coarctation of aorta: Secondary | ICD-10-CM | POA: Diagnosis not present

## 2021-11-01 DIAGNOSIS — I739 Peripheral vascular disease, unspecified: Secondary | ICD-10-CM | POA: Diagnosis not present

## 2021-11-01 DIAGNOSIS — N400 Enlarged prostate without lower urinary tract symptoms: Secondary | ICD-10-CM | POA: Diagnosis not present

## 2021-11-01 DIAGNOSIS — Z79899 Other long term (current) drug therapy: Secondary | ICD-10-CM | POA: Diagnosis not present

## 2021-11-01 DIAGNOSIS — Z20822 Contact with and (suspected) exposure to covid-19: Secondary | ICD-10-CM | POA: Diagnosis not present

## 2021-11-01 DIAGNOSIS — Z8719 Personal history of other diseases of the digestive system: Secondary | ICD-10-CM | POA: Diagnosis not present

## 2021-11-01 DIAGNOSIS — Q2381 Bicuspid aortic valve: Secondary | ICD-10-CM | POA: Insufficient documentation

## 2021-11-02 DIAGNOSIS — I10 Essential (primary) hypertension: Secondary | ICD-10-CM | POA: Diagnosis not present

## 2021-11-02 DIAGNOSIS — I739 Peripheral vascular disease, unspecified: Secondary | ICD-10-CM | POA: Diagnosis not present

## 2021-11-02 DIAGNOSIS — Q231 Congenital insufficiency of aortic valve: Secondary | ICD-10-CM | POA: Diagnosis not present

## 2021-11-02 DIAGNOSIS — Z20822 Contact with and (suspected) exposure to covid-19: Secondary | ICD-10-CM | POA: Diagnosis not present

## 2021-11-02 DIAGNOSIS — Z79899 Other long term (current) drug therapy: Secondary | ICD-10-CM | POA: Diagnosis not present

## 2021-11-02 DIAGNOSIS — I7123 Aneurysm of the descending thoracic aorta, without rupture: Secondary | ICD-10-CM | POA: Diagnosis not present

## 2021-11-02 DIAGNOSIS — N400 Enlarged prostate without lower urinary tract symptoms: Secondary | ICD-10-CM | POA: Diagnosis not present

## 2021-11-02 DIAGNOSIS — Q251 Coarctation of aorta: Secondary | ICD-10-CM | POA: Diagnosis not present

## 2021-11-02 DIAGNOSIS — Z7982 Long term (current) use of aspirin: Secondary | ICD-10-CM | POA: Diagnosis not present

## 2021-11-03 ENCOUNTER — Telehealth: Payer: Self-pay | Admitting: Family Medicine

## 2021-11-03 ENCOUNTER — Telehealth: Payer: Self-pay

## 2021-11-03 DIAGNOSIS — Z20822 Contact with and (suspected) exposure to covid-19: Secondary | ICD-10-CM | POA: Diagnosis not present

## 2021-11-03 DIAGNOSIS — I739 Peripheral vascular disease, unspecified: Secondary | ICD-10-CM | POA: Diagnosis not present

## 2021-11-03 DIAGNOSIS — N400 Enlarged prostate without lower urinary tract symptoms: Secondary | ICD-10-CM | POA: Diagnosis not present

## 2021-11-03 DIAGNOSIS — I7123 Aneurysm of the descending thoracic aorta, without rupture: Secondary | ICD-10-CM | POA: Diagnosis not present

## 2021-11-03 DIAGNOSIS — Z79899 Other long term (current) drug therapy: Secondary | ICD-10-CM | POA: Diagnosis not present

## 2021-11-03 DIAGNOSIS — Q251 Coarctation of aorta: Secondary | ICD-10-CM | POA: Diagnosis not present

## 2021-11-03 DIAGNOSIS — Q231 Congenital insufficiency of aortic valve: Secondary | ICD-10-CM | POA: Diagnosis not present

## 2021-11-03 DIAGNOSIS — I10 Essential (primary) hypertension: Secondary | ICD-10-CM | POA: Diagnosis not present

## 2021-11-03 DIAGNOSIS — Z7982 Long term (current) use of aspirin: Secondary | ICD-10-CM | POA: Diagnosis not present

## 2021-11-03 NOTE — Telephone Encounter (Signed)
Patient returned call to nurse. Requesting call back  - patient will stay by his phone.  Please advise at at [phone].

## 2021-11-03 NOTE — Telephone Encounter (Signed)
Transition Care Management Unsuccessful Follow-up Telephone Call  Date of discharge and from where:  11/03/2020 [facility]  Diagnosis: Coarctation of Aorta   Attempts:  1st Attempt  Reason for unsuccessful TCM follow-up call:  Left voice message

## 2021-11-04 ENCOUNTER — Telehealth: Payer: Self-pay

## 2021-11-04 NOTE — Telephone Encounter (Signed)
Transition Care Management Follow-up Telephone Call Date of discharge and from where: 11/03/21. [facility].  Diagnosis: Coarctation of Aorta How have you been since you were released from the hospital? Pt states he is doing okay, sore but getting around. Any questions or concerns? No  Items Reviewed: Did the pt receive and understand the discharge instructions provided? Yes  Medications obtained and verified? Yes  Other? No  Any new allergies since your discharge? No  Dietary orders reviewed? Yes Do you have support at home? Yes   Home Care and Equipment/Supplies: Were home health services ordered? no If so, what is the name of the agency? N/A  Has the agency set up a time to come to the patient's home? not applicable Were any new equipment or medical supplies ordered?  No What is the name of the medical supply agency? N/A Were you able to get the supplies/equipment? not applicable Do you have any questions related to the use of the equipment or supplies? No  Functional Questionnaire: (I = Independent and D = Dependent) ADLs: I  Bathing/Dressing- I  Meal Prep- I  Eating- I  Maintaining continence- I  Transferring/Ambulation- I  Managing Meds- I  Follow up appointments reviewed:  PCP Hospital f/u appt confirmed? Yes  Scheduled to see Dr. Tanya Nones on 09/09/22 @ 3:30pm. Specialist Hospital f/u appt confirmed?  To follow up with Dr. Marlynn Perking in 1 month.   Are transportation arrangements needed? No  If their condition worsens, is the pt aware to call PCP or go to the Emergency Dept.? Yes Was the patient provided with contact information for the PCP's office or ED? Yes Was to pt encouraged to call back with questions or concerns? Yes

## 2021-11-09 ENCOUNTER — Other Ambulatory Visit: Payer: Self-pay

## 2021-11-09 ENCOUNTER — Ambulatory Visit (INDEPENDENT_AMBULATORY_CARE_PROVIDER_SITE_OTHER): Payer: BC Managed Care – PPO | Admitting: Family Medicine

## 2021-11-09 ENCOUNTER — Encounter: Payer: Self-pay | Admitting: Family Medicine

## 2021-11-09 VITALS — BP 132/92 | HR 68 | Temp 97.1°F | Resp 18 | Ht 72.0 in | Wt 215.0 lb

## 2021-11-09 DIAGNOSIS — I10 Essential (primary) hypertension: Secondary | ICD-10-CM | POA: Diagnosis not present

## 2021-11-09 DIAGNOSIS — Z800 Family history of malignant neoplasm of digestive organs: Secondary | ICD-10-CM | POA: Insufficient documentation

## 2021-11-09 LAB — COMPLETE METABOLIC PANEL WITH GFR
AG Ratio: 1.5 (calc) (ref 1.0–2.5)
ALT: 14 U/L (ref 9–46)
AST: 14 U/L (ref 10–40)
Albumin: 4.5 g/dL (ref 3.6–5.1)
Alkaline phosphatase (APISO): 52 U/L (ref 36–130)
BUN: 9 mg/dL (ref 7–25)
CO2: 34 mmol/L — ABNORMAL HIGH (ref 20–32)
Calcium: 10 mg/dL (ref 8.6–10.3)
Chloride: 98 mmol/L (ref 98–110)
Creat: 0.96 mg/dL (ref 0.60–1.29)
Globulin: 3.1 g/dL (calc) (ref 1.9–3.7)
Glucose, Bld: 96 mg/dL (ref 65–99)
Potassium: 4.5 mmol/L (ref 3.5–5.3)
Sodium: 138 mmol/L (ref 135–146)
Total Bilirubin: 0.8 mg/dL (ref 0.2–1.2)
Total Protein: 7.6 g/dL (ref 6.1–8.1)
eGFR: 98 mL/min/{1.73_m2} (ref >=60)

## 2021-11-09 LAB — CBC WITH DIFFERENTIAL/PLATELET
Absolute Monocytes: 638 cells/uL (ref 200–950)
Basophils Absolute: 61 cells/uL (ref 0–200)
Basophils Relative: 1.1 %
Eosinophils Absolute: 198 cells/uL (ref 15–500)
Eosinophils Relative: 3.6 %
HCT: 43.6 % (ref 38.5–50.0)
Hemoglobin: 14.8 g/dL (ref 13.2–17.1)
Lymphs Abs: 1947 cells/uL (ref 850–3900)
MCH: 34.6 pg — ABNORMAL HIGH (ref 27.0–33.0)
MCHC: 33.9 g/dL (ref 32.0–36.0)
MCV: 101.9 fL — ABNORMAL HIGH (ref 80.0–100.0)
MPV: 10.7 fL (ref 7.5–12.5)
Monocytes Relative: 11.6 %
Neutro Abs: 2657 cells/uL (ref 1500–7800)
Neutrophils Relative %: 48.3 %
Platelets: 217 10*3/uL (ref 140–400)
RBC: 4.28 10*6/uL (ref 4.20–5.80)
RDW: 10.8 % — ABNORMAL LOW (ref 11.0–15.0)
Total Lymphocyte: 35.4 %
WBC: 5.5 10*3/uL (ref 3.8–10.8)

## 2021-11-09 MED ORDER — NEBIVOLOL HCL 5 MG PO TABS: 5.0000 mg | ORAL_TABLET | Freq: Every day | ORAL | 3 refills | Status: DC

## 2021-11-09 NOTE — Progress Notes (Signed)
Subjective:    Patient ID: Mark Bruce, male    DOB: 1970/10/22, 51 y.o.   MRN: [id number]  HPI 10/22 Patient is a very pleasant 51 year old gentleman here today to establish care with me.  Our entire visit today was primarily spent in discussion regarding his blood pressure.  He has a history of hypertension that has been difficult to control.  He is currently on losartan hydrochlorothiazide, amlodipine, Bystolic, and terazosin.  Despite that, his blood pressure is 148/84.  However the patient mentions that he has an abnormality with his heart.  Reviewing his cardiac MRI shows that he has a coarctation of the thoracic aorta which is likely causing elevated pressures in his upper extremities and decreased blood pressure in his lower extremities.  He is scheduled to meet with a vascular surgeon at [facility] later this month to discuss his options for repair.  He denies any chest pain shortness of breath or dyspnea on exertion.  Also reviewed his echocardiogram of his heart.  He has trace tricuspid and mitral regurgitation as well as aortic regurgitation.  He does have moderate concentric hypertrophy likely due to longstanding hypertension.  Otherwise his echocardiogram was normal.   We spent the majority of our 15-minute encounter today discussing his blood pressure, along with his other medical condition.  I will refill his blood pressure medication.  I will allow permissive hypertension as long as his systolic blood pressure is not higher than 150 until he meets with the vascular surgeon.  Recently had a BMP in June that was normal.  Cholesterol was normal in January  11/09/21 Per DC Summary from [facility]: Surgeries Performed: Covered stent repair of distal arch/prox descending thoracic aorta coarctation w/ L SCA coverage on 11/02/21   Brief History of Present Illness: Per admission H&P dated 11/01/2021: "Mr. Mark Bruce is a 51 y.o. year old male patient who was seen in consultation by Dr. Ysidro Evert on  11/22 for surgical evaluation of aortic coarctation. The patient has known about aortic coarctation since 2004 but was lost to follow up until recent ED visit in June 2022 for profound hypertension. CTA chest, abdomen, and pelvis demonstrated a tight juxtaductal aortic coarctation with mean diameter of 11-12 mm, high grade narrowing distal to the L SCA and bicuspid AV. Given the above, it was recommended to proceed with surgical repair."    Here for follow up.  He has done well postoperatively.  He denies any fevers or chills.  He denies any cough or shortness of breath or chest pain.  He denies any pleurisy.  He denies any dysuria or urgency or frequency.  He does complain of erectile dysfunction.  He thinks that he did better when he was taking Bystolic compared to the combination of Hyzaar and terazosin.  He would like to try to switch back to this.  He denies any pain at the catheterization site.  There is a small hematoma in the right inguinal canal but there is no erythema or warmth or fluctuance.  He also has some bruising around his radial artery on both wrist but there is no evidence of a hematoma. Past Medical History:  Diagnosis Date   Allergy    Hypertension    Past Surgical History:  Procedure Laterality Date   HERNIA REPAIR  06/07/12   [facility] by Dr. Lilyan Punt   Current Outpatient Medications on File Prior to Visit  Medication Sig Dispense Refill   losartan-hydrochlorothiazide (HYZAAR) 100-25 MG tablet TAKE 1 TABLET BY MOUTH DAILY FOR  BLOOD PRESSURE 30 tablet 0   magnesium oxide (MAG-OX) 400 MG tablet Take 400 mg by mouth daily.     oxyCODONE (OXY IR/ROXICODONE) 5 MG immediate release tablet Take 5 mg by mouth 4 (four) times daily as needed.     terazosin (HYTRIN) 1 MG capsule Take 1 capsule (1 mg total) by mouth at bedtime. 90 capsule 3   Turmeric (QC TUMERIC COMPLEX PO) Take by mouth.     No current facility-administered medications on file prior to visit.   Marland Kitchenall Social History    Socioeconomic History   Marital status: Married    Spouse name: Not on file   Number of children: 1   Years of education: Not on file   Highest education level: Not on file  Occupational History   Not on file  Tobacco Use   Smoking status: Never   Smokeless tobacco: Never  Vaping Use   Vaping Use: Never used  Substance and Sexual Activity   Alcohol use: Yes    Alcohol/week: 2.0 standard drinks    Types: 2 Shots of liquor per week    Comment: OCC   Drug use: No   Sexual activity: Yes  Other Topics Concern   Not on file  Social History Narrative   Not on file   Social Determinants of Health   Financial Resource Strain: Not on file  Food Insecurity: Not on file  Transportation Needs: Not on file  Physical Activity: Not on file  Stress: Not on file  Social Connections: Not on file  Intimate Partner Violence: Not on file     Review of Systems  All other systems reviewed and are negative.     Objective:   Physical Exam Vitals reviewed.  Constitutional:      Appearance: Normal appearance. He is normal weight.  Cardiovascular:     Rate and Rhythm: Normal rate and regular rhythm.     Heart sounds: Normal heart sounds.  Pulmonary:     Effort: Pulmonary effort is normal.     Breath sounds: Normal breath sounds.  Neurological:     Mental Status: He is alert.          Assessment & Plan:   Benign essential HTN - Plan: CBC with Differential/Platelet, COMPLETE METABOLIC PANEL WITH GFR Blood pressure is borderline today however at home he states that his blood pressure is 120-130/70-80.  I am happy with this.  We will discontinue terazosin and replaced with Bystolic 5 mg a day to see if he has better fewer side effects on this.  Check CBC and CMP today to follow-up postoperatively.  Reassess in 6 months or sooner if symptoms arise.  Recommended that he follow-up annually with his cardiologist to monitor his bicuspid aortic valve.

## 2021-11-30 DIAGNOSIS — Z8774 Personal history of (corrected) congenital malformations of heart and circulatory system: Secondary | ICD-10-CM | POA: Diagnosis not present

## 2021-11-30 DIAGNOSIS — Z95828 Presence of other vascular implants and grafts: Secondary | ICD-10-CM | POA: Diagnosis not present

## 2021-11-30 DIAGNOSIS — Z7982 Long term (current) use of aspirin: Secondary | ICD-10-CM | POA: Diagnosis not present

## 2021-11-30 DIAGNOSIS — Z9889 Other specified postprocedural states: Secondary | ICD-10-CM | POA: Diagnosis not present

## 2021-11-30 DIAGNOSIS — Q251 Coarctation of aorta: Secondary | ICD-10-CM | POA: Diagnosis not present

## 2021-11-30 DIAGNOSIS — I517 Cardiomegaly: Secondary | ICD-10-CM | POA: Diagnosis not present

## 2021-11-30 DIAGNOSIS — R9431 Abnormal electrocardiogram [ECG] [EKG]: Secondary | ICD-10-CM | POA: Diagnosis not present

## 2021-11-30 DIAGNOSIS — I779 Disorder of arteries and arterioles, unspecified: Secondary | ICD-10-CM | POA: Diagnosis not present

## 2021-11-30 DIAGNOSIS — Z79899 Other long term (current) drug therapy: Secondary | ICD-10-CM | POA: Diagnosis not present

## 2021-12-03 ENCOUNTER — Other Ambulatory Visit: Payer: Self-pay

## 2021-12-03 ENCOUNTER — Encounter: Payer: Self-pay | Admitting: Cardiology

## 2021-12-03 ENCOUNTER — Ambulatory Visit: Payer: BC Managed Care – PPO | Admitting: Cardiology

## 2021-12-03 VITALS — BP 140/76 | HR 85 | Temp 98.4°F | Resp 16 | Ht 72.0 in | Wt 215.8 lb

## 2021-12-03 DIAGNOSIS — I10 Essential (primary) hypertension: Secondary | ICD-10-CM | POA: Diagnosis not present

## 2021-12-03 DIAGNOSIS — Z8774 Personal history of (corrected) congenital malformations of heart and circulatory system: Secondary | ICD-10-CM | POA: Insufficient documentation

## 2021-12-03 DIAGNOSIS — Q231 Congenital insufficiency of aortic valve: Secondary | ICD-10-CM

## 2021-12-03 NOTE — Progress Notes (Signed)
Primary Physician/Referring:  Susy Frizzle, MD  Patient ID: Mark Bruce, male    DOB: October 15, 1971, 51 y.o.   MRN: [id number]  Chief Complaint  Patient presents with   Coarctation of the aorta, complex   Follow-up     HPI:    Mark Bruce  is a 51 y.o. [place] Male patient with longstanding history of hypertension,  coarctation of the aorta since 2004, lost to follow-up, underwent endograft repair of the coarctation of the aorta with stent implantation at [facility] on 11/02/2021.  He now presents for follow-up.  Bystolic was discontinued as his blood pressure has been well controlled now.  Patient states that he is feeling the best he has in a while and feels like there is significant amount of strength in his legs and he has been able to exercise without limitations.  He has lost weight since last office visit and has been careful with his diet.  Brings home blood pressure recordings which are under excellent control.  Remains asymptomatic.  Past Medical History:  Diagnosis Date   Allergy    Hypertension    Past Surgical History:  Procedure Laterality Date   COARCTATION OF AORTA REPAIR     Covered stent repair of distal arch/prox descending thoracic aorta coarctation w/ L SCA coverage on 11/02/21  [facility]   HERNIA REPAIR  06/07/2012   [facility] by Dr. Lilyan Punt   Family History  Problem Relation Age of Onset   Hypertension Mother    Cancer Father        Colon cancer     Social History   Tobacco Use   Smoking status: Never   Smokeless tobacco: Never  Substance Use Topics   Alcohol use: Yes    Alcohol/week: 2.0 standard drinks    Types: 2 Shots of liquor per week    Comment: OCC   Marital Status: Married  ROS  Review of Systems  Cardiovascular:  Negative for chest pain, dyspnea on exertion and leg swelling.  Gastrointestinal:  Negative for melena.  Objective  Blood pressure 140/76, pulse 85, temperature 98.4 F (36.9 C), temperature source  Temporal, resp. rate 16, height 6' (1.829 m), weight 215 lb 12.8 oz (97.9 kg), SpO2 99 %. Body mass index is 29.27 kg/m.  Vitals with BMI 12/04/2021 12/03/2021 12/03/2021  Height - 6\' 0"  6\' 0"   Weight - 215 lbs 13 oz -  BMI - 123XX123 -  Systolic [id number]  Diastolic 76 82 92  Pulse - 85 86     Physical Exam Constitutional:      Appearance: He is overweight.  Neck:     Vascular: No JVD.  Cardiovascular:     Rate and Rhythm: Normal rate and regular rhythm.     Pulses: Normal pulses and intact distal pulses.     Heart sounds: Murmur heard.  Crescendo systolic murmur is present with a grade of 2/6 at the upper left sternal border.    No gallop.  Pulmonary:     Effort: Pulmonary effort is normal.     Breath sounds: Normal breath sounds.  Abdominal:     General: Bowel sounds are normal.     Palpations: Abdomen is soft.  Musculoskeletal:        General: No swelling.  Skin:    Capillary Refill: Capillary refill takes less than 2 seconds.  Neurological:     General: No focal deficit present.     Laboratory examination:   Recent  Labs    03/18/21 1316 11/09/21 1558  NA 141 138  K 3.7 4.5  CL 104 98  CO2 29 34*  GLUCOSE 116* 96  BUN 12 9  CREATININE 0.94 0.96  CALCIUM 9.9 10.0  GFRNONAA >60  --    CrCl cannot be calculated (Patient's most recent lab result is older than the maximum 21 days allowed.).  CMP Latest Ref Rng & Units 11/09/2021 03/18/2021 11/11/2020  Glucose 65 - 99 mg/dL 96 116(H) 92  BUN 7 - 25 mg/dL 9 12 12   Creatinine 0.60 - 1.29 mg/dL 0.96 0.94 0.94  Sodium 135 - 146 mmol/L 138 141 136  Potassium 3.5 - 5.3 mmol/L 4.5 3.7 3.8  Chloride 98 - 110 mmol/L 98 104 99  CO2 20 - 32 mmol/L 34(H) 29 29  Calcium 8.6 - 10.3 mg/dL 10.0 9.9 9.6  Total Protein 6.1 - 8.1 g/dL 7.6 - 7.2  Total Bilirubin 0.2 - 1.2 mg/dL 0.8 - 1.1  Alkaline Phos 40 - 115 U/L - - -  AST 10 - 40 U/L 14 - 17  ALT 9 - 46 U/L 14 - 24   CBC Latest Ref Rng & Units 11/09/2021 03/18/2021 11/11/2020   WBC 3.8 - 10.8 Thousand/uL 5.5 4.8 4.1  Hemoglobin 13.2 - 17.1 g/dL 14.8 15.7 15.5  Hematocrit 38.5 - 50.0 % 43.6 45.4 44.8  Platelets 140 - 400 Thousand/uL 217 199 187    Lipid Panel No results for input(s): CHOL, TRIG, LDLCALC, VLDL, HDL, CHOLHDL, LDLDIRECT in the last 8760 hours.  Lipid Panel     Component Value Date/Time   CHOL 168 11/11/2020 1038   TRIG 92 11/11/2020 1038   HDL 49 11/11/2020 1038   CHOLHDL 3.4 11/11/2020 1038   VLDL 17 12/28/2016 0852   LDLCALC 100 (H) 11/11/2020 1038     HEMOGLOBIN A1C Lab Results  Component Value Date   HGBA1C 4.4 06/30/2017   MPG 80 06/30/2017   TSH Recent Labs    03/18/21 1316  TSH 2.021   Medications and allergies  No Known Allergies   FINAL MEDICATION AS OF TODAY:   Medications after current encounter Current Outpatient Medications  Medication Instructions   aspirin 81 MG chewable tablet Oral, Daily   cholecalciferol (VITAMIN D3) 1,000 Units, Oral, Daily   losartan-hydrochlorothiazide (HYZAAR) 100-25 MG tablet 1 tablet, Oral, Daily, for blood pressure   magnesium oxide (MAG-OX) 400 mg, Oral, Daily   terazosin (HYTRIN) 1 mg, Oral, Daily at bedtime   Turmeric (QC TUMERIC COMPLEX PO) Oral   Radiology:     Cardiac Studies:   Echocardiogram 04/21/2021:  Left ventricle cavity is normal in size. Moderate concentric hypertrophy  of the left ventricle. Normal global wall motion. Normal LV systolic  function with EF 55%. Doppler evidence of grade I (impaired) diastolic  dysfunction, normal LAP.  Structurally normal trileaflet aortic valve. No evidence of aortic  stenosis. Mild (Grade I) aortic regurgitation.  Mild (Grade I) mitral regurgitation.  Mild tricuspid regurgitation.  No evidence of pulmonary hypertension.  Coarcted portion of aorta not visualized on this study.  ABI 04/21/2021:  This exam reveals moderately decreased perfusion of the right lower  extremity, noted at the post tibial artery level (ABI 0.64)  and moderately  decreased perfusion of the left lower extremity, noted at the post tibial  artery level (ABI 0.61).  TEE 11/02/2021: Pre TEE:    - Normal Biventricular Function    - Mild AI, Trace MR    - Bicuspid AV  with partial L and R Cusp Fusion        Post TEE:    - Normal Biventricular Function    - Mild AI, Trace MR    - Bicuspid AV with partial L and R Cusp Fusion    - Stent in Distal Aortic Arch to Proximal Descending Thoracic Aorta   Coarctation of the aorta repair with endograft 11/02/2021: Covered stent repair of distal arch/prox descending thoracic aorta coarctation w/ L SCA coverage.   EKG:   EKG 12/03/2021: Normal sinus rhythm at rate of 76 bpm, left atrial enlargement, left axis deviation, left anterior fascicular block.  Incomplete right bundle branch block.  No evidence of ischemia.  No significant change from 04/15/2021.   Assessment     ICD-10-CM   1. Primary hypertension  I10 EKG 12-Lead    2. History of repair of coarctation of aorta  Z87.74    Covered stent repair of distal arch/prox descending thoracic aorta coarctation w/ L SCA coverage on 11/02/21     3. Bicuspid aortic valve  Q23.1        Medications Discontinued During This Encounter  Medication Reason   nebivolol (BYSTOLIC) 5 MG tablet Discontinued by provider   nebivolol (BYSTOLIC) 5 MG tablet Discontinued by provider     No orders of the defined types were placed in this encounter.   Orders Placed This Encounter  Procedures   EKG 12-Lead   Recommendations:   Mark Bruce is a 51 y.o. [place] Male patient with longstanding history of hypertension,  coarctation of the aorta since 2004, lost to follow-up, underwent endograft repair of the coarctation of the aorta with stent implantation at [facility] on 11/02/2021.  He now presents for follow-up.  Since coarctation repair, blood pressure is essentially improved significantly, Bystolic has been discontinued at [facility]  Blood pressure today is well controlled and he has equal blood pressure in both upper extremity and excellent pulses in his lower extremity.  He remains asymptomatic.  I would like him to lose additional 10 pounds in weight.  He appears motivated.  For now he will continue with vascular follow-up at [facility], I would like to see him back in 6 months.  I reviewed his TEE, although transthoracic echocardiogram and suggested tricuspid aortic valve, he has bicuspid aortic valve which is classic for patients with coarctation of the aorta.   Adrian Prows, MD, [facility] 12/04/2021, 8:28 AM Office: [phone] Fax: [phone] Pager: [phone]

## 2021-12-04 IMAGING — MR MR CHEST MEDIASTINUM WO/W CM
13 series · 16 of 16 positions shown · IV contrast (multihance)
Comparison: Report from MR 03/19/2003 with no images available

CLINICAL DATA: 49-year-old male with a history coarctation and
diminished distal pulses.

EXAM:
MRI CHEST WITHOUT AND WITH CONTRAST
TECHNIQUE: Multiplanar/multisequence MR acquisition performed of the chest
before and after administration of standard contrast bolus. MR
angiogram images with 3D sequences included for review.
CONTRAST:  20mL MULTIHANCE GADOBENATE DIMEGLUMINE 529 MG/ML IV SOLN

[Series 3: bSSFP · axial · 6.0mm · 1.48mm/px · 1 of 45 slices shown (1 of 2)]
[im 1/45]
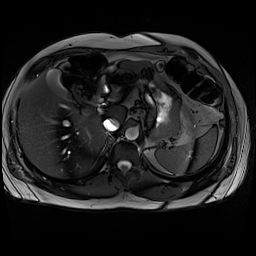

[Series 4: axial haste · axial · 5.0mm · 0.74mm/px · 1 of 42 slices shown]
[im 1/42]
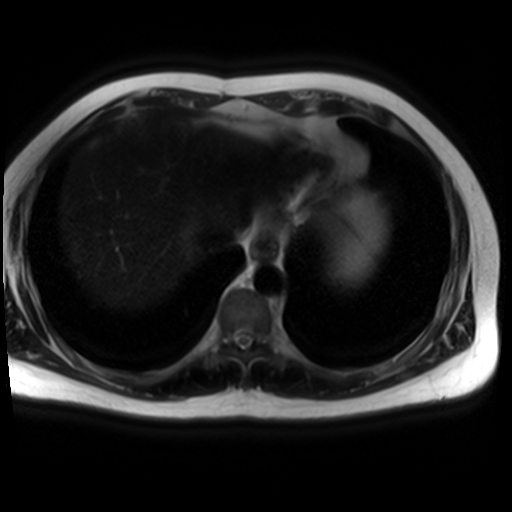

[Series 5: T1 fat-sat · axial · 5.0mm · 1.48mm/px · 1 of 42 slices shown]
[im 1/42]
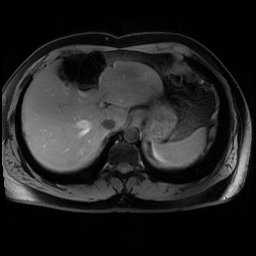

[Series 6: bSSFP · sagittal · 4.0mm · 1.56mm/px · 1 of 33 slices shown (2 of 2)]
[im 1/33]
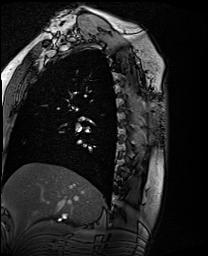

[Series 7: T1 dynamic · axial · non-contrast · 2.5mm · 1.56mm/px · 1 of 88 slices shown]
[im 1/88]
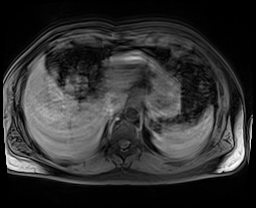

[Series 9: vessel_scout · sagittal · 3.0mm · 1.56mm/px · 1 of 34 slices shown]
[im 1/34]
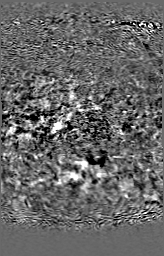

[Series 11: sag flash candy · sagittal · non-contrast · 1.5mm · 1.04mm/px · 1 of 88 slices shown (1 of 3)]
[im 1/88]
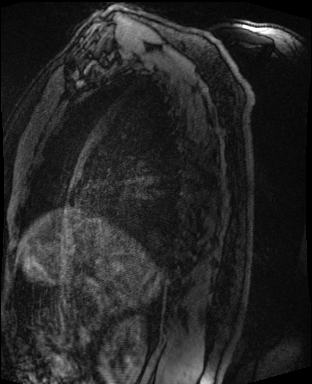

[Series 12: care_bolus_sag · sagittal · 50.0mm · 1.56mm/px · 1 of 26 slices shown]
[im 1/26]
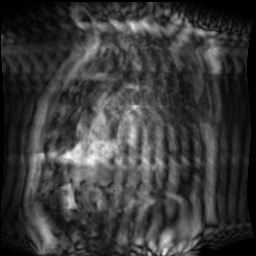

[Series 13: sag flash candy · sagittal · 1.5mm · 1.04mm/px · 1 of 88 slices shown (2 of 3)]
[im 1/88]
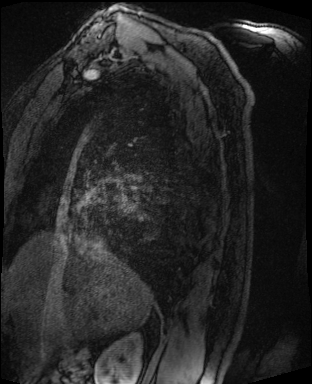

[Series 14: sag flash candy · sagittal · 1.5mm · 1.04mm/px · 2 of 88 slices shown (3 of 3)]
[im 1/88]
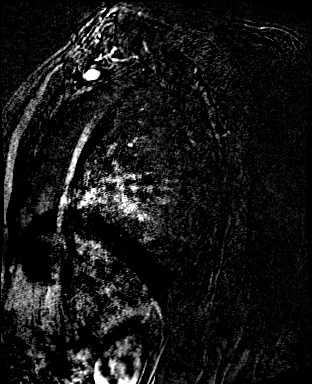
[im 88/88]
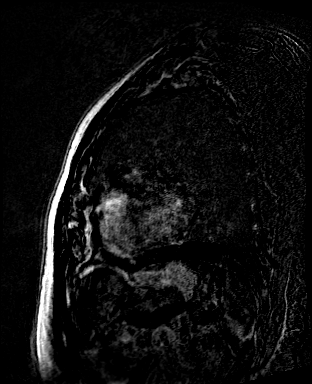

[Series 16: T1 dynamic post-contrast · axial · 2.5mm · 1.56mm/px · z∈[-105,+112]mm · 2 of 88 slices shown (1 of 2)]
[im 1/88]
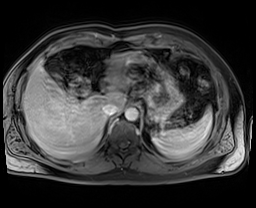
[im 88/88]
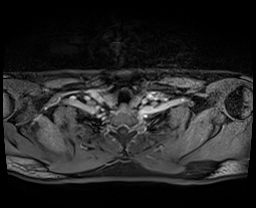

[Series 17: T1 dynamic post-contrast · axial · 2.5mm · 1.56mm/px · z∈[-105,+112]mm · 2 of 88 slices shown (2 of 2)]
[im 1/88]
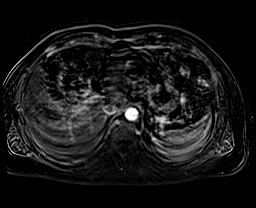
[im 88/88]
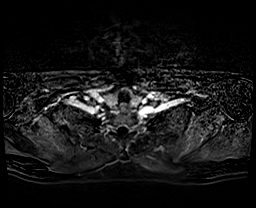

[Series 18: T1 fat-sat post-contrast · axial · 5.0mm · 1.48mm/px · 1 of 38 slices shown]
[im 1/38]
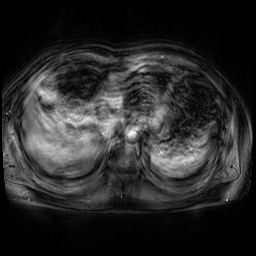

[16 of 16 positions shown; findings below may reference images not displayed]

FINDINGS: Vascular:

Aorta:

Resolution of the MR is not adequate for assessment of the aortic
root.

Estimated greatest diameter of the ascending aorta 36 mm.

Three vessel arch with unremarkable flow signal within the branch
vessels.

Estimated diameter of the innominate artery 15 mm. Estimated
diameter of the proximal left common carotid artery 9 mm.

Estimated diameter of the proximal left subclavian artery is 15 mm.
There is some mild tortuosity of the branch vessels.

MR demonstrates changes of aortic coarctation of the distal thoracic
arch/proximal descending thoracic aorta, just beyond the left
subclavian artery origin. The resolution of the MR angiogram limits
the specificity of the exam regarding a true cross-sectional
diameter, however, the sagittal sequence 13 demonstrates a channel
which is estimated 5 mm-6 mm, with evidence of a circumferential web
at the site of coarctation. Redemonstration of poststenotic dilation
of the proximal descending thoracic aorta, estimated 31 mm on the
axial images, essentially unchanged from the prior report.

Enlargement of the internal mammary arteries as expected. Prominent
supra remainder costal/segmental arteries from the proximal
descending thoracic aorta.

Collateral vasculature formation of the mediastinum
(bronchial/segmental arteries) and of the superficial thorax (Citto
cervical trunk, thyrocervical trunk/segmental arteries).

No evidence of dissection.

Pulmonary arteries:

Unremarkable diameter of the main pulmonary artery. Flow signal
maintained.

Heart:

Heart size within normal limits.  No pericardial fluid.

Nonvascular:

Lung/pleura: Unremarkable signal

Mediastinum: Unremarkable appearance of thoracic inlet in the
mediastinum with no adenopathy.

Chest wall/musculoskeletal:

Unremarkable

Upper abdomen: Unremarkable
IMPRESSION: MR angiogram negative for evidence of thoracic aortic aneurysm.

MR angiogram positive for aortic coarctation, with expected changes
of mild post-stenotic aortic dilation, collateral arterialization,
mild tortuosity of the branch vessels.

## 2021-12-08 ENCOUNTER — Other Ambulatory Visit: Payer: Self-pay

## 2021-12-08 MED ORDER — LOSARTAN POTASSIUM-HCTZ 100-25 MG PO TABS: 1.0000 | ORAL_TABLET | Freq: Every day | ORAL | 3 refills | Status: DC

## 2021-12-13 ENCOUNTER — Encounter: Payer: Self-pay | Admitting: Cardiology

## 2021-12-13 ENCOUNTER — Telehealth: Payer: Self-pay | Admitting: Cardiology

## 2021-12-13 DIAGNOSIS — Q231 Congenital insufficiency of aortic valve: Secondary | ICD-10-CM

## 2021-12-13 NOTE — Telephone Encounter (Signed)
Bicuspid aortic valve - Plan: PCV CARDIAC STRESS TEST

## 2022-01-04 ENCOUNTER — Telehealth: Payer: Self-pay

## 2022-01-04 NOTE — Telephone Encounter (Signed)
Pharmacy faxed prescription renewal request for  ? ?terazosin (HYTRIN) 1 MG capsule [[id number]]  ?  Order Details ?Dose: 1 mg Route: Oral Frequency: Daily at bedtime  ?Dispense Quantity: 90 capsule Refills: 3   ?     ?Sig: Take 1 capsule (1 mg total) by mouth at bedtime.  ?     ?Start Date: 07/22/21 End Date: --  ?Written Date: 07/22/21 Expiration Date: 07/22/22  ? ?

## 2022-01-07 ENCOUNTER — Other Ambulatory Visit: Payer: Self-pay

## 2022-01-07 MED ORDER — TERAZOSIN HCL 1 MG PO CAPS: 1.0000 mg | ORAL_CAPSULE | Freq: Every day | ORAL | 3 refills | Status: DC

## 2022-01-28 ENCOUNTER — Ambulatory Visit: Payer: BC Managed Care – PPO

## 2022-01-28 DIAGNOSIS — Q231 Congenital insufficiency of aortic valve: Secondary | ICD-10-CM | POA: Diagnosis not present

## 2022-04-05 ENCOUNTER — Telehealth: Payer: Self-pay

## 2022-04-05 NOTE — Telephone Encounter (Signed)
Called spoke with pt and advice pt to talk to his dentist regarding his concerns related to his dental needs.   Pt voiced understanding and will call his dentist.

## 2022-04-05 NOTE — Telephone Encounter (Signed)
Pt called in stating that he has to go to the dentist next week. Pt states that he was unsure of the name of the med that is required for him to take before this appt. Pt would like to speak with clinical staff asap so that he can get this med called in to his pharmacy and be able to take this med before his dentist appt. Please advise.  Cb#: [phone]

## 2022-04-07 ENCOUNTER — Encounter: Payer: Self-pay | Admitting: Cardiology

## 2022-04-08 ENCOUNTER — Telehealth: Payer: Self-pay

## 2022-06-06 ENCOUNTER — Ambulatory Visit: Payer: BC Managed Care – PPO | Admitting: Cardiology

## 2022-06-08 ENCOUNTER — Ambulatory Visit: Payer: BC Managed Care – PPO | Admitting: Cardiology

## 2022-06-13 ENCOUNTER — Encounter: Payer: Self-pay | Admitting: Cardiology

## 2022-06-13 ENCOUNTER — Ambulatory Visit: Payer: BC Managed Care – PPO | Admitting: Cardiology

## 2022-06-13 VITALS — BP 129/83 | HR 66 | Temp 98.4°F | Resp 16 | Ht 72.0 in | Wt 217.0 lb

## 2022-06-13 DIAGNOSIS — Q231 Congenital insufficiency of aortic valve: Secondary | ICD-10-CM | POA: Diagnosis not present

## 2022-06-13 DIAGNOSIS — I10 Essential (primary) hypertension: Secondary | ICD-10-CM | POA: Diagnosis not present

## 2022-06-13 DIAGNOSIS — Z8774 Personal history of (corrected) congenital malformations of heart and circulatory system: Secondary | ICD-10-CM | POA: Diagnosis not present

## 2022-06-13 MED ORDER — LOSARTAN POTASSIUM 100 MG PO TABS: 100.0000 mg | ORAL_TABLET | Freq: Every evening | ORAL | 3 refills | Status: DC

## 2022-06-13 NOTE — Progress Notes (Signed)
Primary Physician/Referring:  Donita Brooks, MD  Patient ID: Mark Bruce, male    DOB: [birth year], 51 y.o.   MRN: [id number]  Chief Complaint  Patient presents with   Hypertension   Biscuspid aortic valve   Follow-up    6 months     HPI:    Mark Bruce  is a 51 y.o. AA Male patient with longstanding history of hypertension,  coarctation of the aorta since 2004, lost to follow-up, underwent endograft repair of the coarctation of the aorta with stent implantation at [facility] on 11/02/2021.  He now presents for follow-up.  He has occasional episodes of dizziness and has noticed low blood pressure, otherwise remains asymptomatic.  Past Medical History:  Diagnosis Date   Allergy    Hypertension    Past Surgical History:  Procedure Laterality Date   COARCTATION OF AORTA REPAIR     Covered stent repair of distal arch/prox descending thoracic aorta coarctation w/ L SCA coverage on 11/02/21  [facility]   HERNIA REPAIR  06/07/2012   [facility]H by Dr. Biagio Quint   Family History  Problem Relation Age of Onset   Hypertension Mother    Cancer Father        Colon cancer     Social History   Tobacco Use   Smoking status: Never   Smokeless tobacco: Never  Substance Use Topics   Alcohol use: Yes    Alcohol/week: 2.0 standard drinks of alcohol    Types: 2 Shots of liquor per week    Comment: OCC   Marital Status: Married  ROS  Review of Systems  Cardiovascular:  Negative for chest pain, dyspnea on exertion and leg swelling.  Gastrointestinal:  Negative for melena.   Objective  Blood pressure 129/83, pulse 66, temperature 98.4 F (36.9 C), temperature source Temporal, resp. rate 16, height 6' (1.829 m), weight 217 lb (98.4 kg), SpO2 98 %. Body mass index is 29.43 kg/m.     06/13/2022    9:42 AM 06/13/2022    9:34 AM 12/04/2021    8:21 AM  Vitals with BMI  Height  6\' 0"    Weight  217 lbs   BMI  29.42   Systolic 129 146  Diastolic 83 87 76   Pulse 66 62     Wt Readings from Last 3 Encounters:  06/13/22 217 lb (98.4 kg)  12/03/21 215 lb 12.8 oz (97.9 kg)  11/09/21 215 lb (97.5 kg)     Physical Exam Constitutional:      Appearance: He is overweight.  Neck:     Vascular: No JVD.  Cardiovascular:     Rate and Rhythm: Normal rate and regular rhythm.     Pulses: Normal pulses and intact distal pulses.     Heart sounds: Murmur heard.     Crescendo systolic murmur is present with a grade of 2/6 at the upper left sternal border.     No gallop.  Pulmonary:     Effort: Pulmonary effort is normal.     Breath sounds: Normal breath sounds.  Abdominal:     General: Bowel sounds are normal.     Palpations: Abdomen is soft.  Musculoskeletal:        General: No swelling.  Skin:    Capillary Refill: Capillary refill takes less than 2 seconds.  Neurological:     General: No focal deficit present.      Laboratory examination:   Recent Labs    11/09/21  1558  NA 138  K 4.5  CL 98  CO2 34*  GLUCOSE 96  BUN 9  CREATININE 0.96  CALCIUM 10.0   CrCl cannot be calculated (Patient's most recent lab result is older than the maximum 21 days allowed.).     Latest Ref Rng & Units 11/09/2021    3:58 PM 03/18/2021    1:16 PM 11/11/2020   10:38 AM  CMP  Glucose 65 - 99 mg/dL 96  751  92   BUN 7 - 25 mg/dL 9  12  12    Creatinine 0.60 - 1.29 mg/dL  0.25  8.52   Sodium 135 - 146 mmol/L 138  141  136   Potassium 3.5 - 5.3 mmol/L 4.5  3.7  3.8   Chloride 98 - 110 mmol/L 98  104  99   CO2 20 - 32 mmol/L 34  29  29   Calcium 8.6 - 10.3 mg/dL 7.78  9.9  9.6   Total Protein 6.1 - 8.1 g/dL 7.6   7.2   Total Bilirubin 0.2 - 1.2 mg/dL 0.8   1.1   AST 10 - 40 U/L 14   17   ALT 9 - 46 U/L 14   24       Latest Ref Rng & Units 11/09/2021    3:58 PM 03/18/2021    1:16 PM 11/11/2020   10:38 AM  CBC  WBC 3.8 - 10.8 Thousand/uL 5.5  4.8  4.1   Hemoglobin 13.2 - 17.1 g/dL 11/13/2020  35.3  61.4   Hematocrit 38.5 - 50.0 % 43.6  45.4  44.8    Platelets 140 - 400 Thousand/uL 217  199  187     Lipid Panel     Component Value Date/Time   CHOL 168 11/11/2020 1038   TRIG 92 11/11/2020 1038   HDL 49 11/11/2020 1038   CHOLHDL 3.4 11/11/2020 1038   VLDL 17 12/28/2016 0852   LDLCALC 100 (H) 11/11/2020 1038     HEMOGLOBIN A1C Lab Results  Component Value Date   HGBA1C 4.4 06/30/2017   MPG 80 06/30/2017   TSH No results for input(s): "TSH" in the last 8760 hours.  Medications and allergies  No Known Allergies   FINAL MEDICATION AS OF TODAY:    Current Outpatient Medications:    aspirin 81 MG chewable tablet, Chew by mouth daily., Disp: , Rfl:    losartan-hydrochlorothiazide (HYZAAR) 100-25 MG tablet, Take 1 tablet by mouth daily. for blood pressure, Disp: 90 tablet, Rfl: 3   magnesium oxide (MAG-OX) 400 MG tablet, Take 400 mg by mouth daily., Disp: , Rfl:    nebivolol (BYSTOLIC) 5 MG tablet, Take 5 mg by mouth daily., Disp: , Rfl:    Turmeric (QC TUMERIC COMPLEX PO), Take by mouth., Disp: , Rfl:   Radiology:   CT angiogram chest post coagulation repair 11/30/2021: Significant improvement in caliber of thoracic aorta at the  level of coarctation compared to previous exam and marked interval decrease  in caliber of the bronchial arteries arising distal to the coarctation. One  bronchial artery appears to have undergone thrombosis since the prior  study.  Cardiac Studies:   Echocardiogram 04/21/2021:  Left ventricle cavity is normal in size. Moderate concentric hypertrophy  of the left ventricle. Normal global wall motion. Normal LV systolic  function with EF 55%. Doppler evidence of grade I (impaired) diastolic  dysfunction, normal LAP.  Structurally normal trileaflet aortic valve. No evidence of aortic  stenosis. Mild (  Grade I) aortic regurgitation.  Mild (Grade I) mitral regurgitation.  Mild tricuspid regurgitation.  No evidence of pulmonary hypertension.  Coarcted portion of aorta not visualized on this  study.  ABI 04/21/2021:  This exam reveals moderately decreased perfusion of the right lower  extremity, noted at the post tibial artery level (ABI 0.64) and moderately  decreased perfusion of the left lower extremity, noted at the post tibial  artery level (ABI 0.61).  TEE 11/02/2021: Pre TEE:    - Normal Biventricular Function    - Mild AI, Trace MR    - Bicuspid AV with partial L and R Cusp Fusion        Post TEE:    - Normal Biventricular Function    - Mild AI, Trace MR    - Bicuspid AV with partial L and R Cusp Fusion    - Stent in Distal Aortic Arch to Proximal Descending Thoracic Aorta   Coarctation of the aorta repair with endograft 11/02/2021: Covered stent repair of distal arch/prox descending thoracic aorta coarctation w/ L SCA coverage.   PCV CARDIAC STRESS TEST 01/28/2022  The patient exercised for 6 minutes and 36 seconds on Bruce protocol; achieved 7.95 METs at 88% of maximum predicted heart rate. Chest pain is not present. No arrhythmias noted on ECG at stress. The heart rate response was normal. The blood pressure response was normal. Negative for ischemia by treadmill stress EKG. Low risk.   EKG:   EKG 06/13/2022: Normal sinus rhythm at rate of 57 bpm, left atrial enlargement, left axis deviation, left anterior fascicular block.  IRBBB, LVH.  No significant change from 12/03/2021.  Assessment     ICD-10-CM   1. Bicuspid aortic valve  Q23.1     2. Primary hypertension  I10 EKG 12-Lead    3. History of repair of coarctation of aorta  Z87.74        Medications Discontinued During This Encounter  Medication Reason   cholecalciferol (VITAMIN D3) 25 MCG (1000 UNIT) tablet    terazosin (HYTRIN) 1 MG capsule    Magnesium 400 MG CAPS    Aspirin (VAZALORE) 81 MG CAPS      No orders of the defined types were placed in this encounter.   Orders Placed This Encounter  Procedures   EKG 12-Lead   Recommendations:   OZELL JUHASZ is a 51 y.o. AA Male  patient with longstanding history of hypertension,  coarctation of the aorta since 2004, lost to follow-up, underwent endograft repair of the coarctation of the aorta with stent implantation at [facility] on 11/02/2021.  He now presents for 59-month follow-up.  Patient continues to have occasional episodes of dizziness, but overall improved.  He has noticed blood pressure to be very low at times.  I would like to discontinue losartan HCT and switch him to plain losartan 100 mg in the evening, will also reduce the dose of Bystolic from 5 mg daily to 2.5 mg in the morning.  I would like to see him back in 6 months, advised him that his goal blood pressure is 130/80 mmHg or less.  In view of bicuspid aortic valve, coarctation of aorta repair, hypertension, would like to keep his blood pressure at target less than 130/80 mmHg or less.  Otherwise remains asymptomatic, no change in his murmur.  He has an appointment to see [facility] in January 2024, after that I can potentially take over his care including monitoring of his coarctation  repair.  Office visit in 6 months.   Yates Decamp, MD, [facility] 06/13/2022, 9:57 AM Office: [phone] Fax: [phone] Pager: [phone]

## 2022-11-09 ENCOUNTER — Other Ambulatory Visit: Payer: Self-pay | Admitting: Family Medicine

## 2022-12-09 DIAGNOSIS — Z95828 Presence of other vascular implants and grafts: Secondary | ICD-10-CM | POA: Diagnosis not present

## 2022-12-09 DIAGNOSIS — Z8774 Personal history of (corrected) congenital malformations of heart and circulatory system: Secondary | ICD-10-CM | POA: Diagnosis not present

## 2022-12-09 DIAGNOSIS — Z79899 Other long term (current) drug therapy: Secondary | ICD-10-CM | POA: Diagnosis not present

## 2022-12-09 DIAGNOSIS — Q251 Coarctation of aorta: Secondary | ICD-10-CM | POA: Diagnosis not present

## 2022-12-09 DIAGNOSIS — Q231 Congenital insufficiency of aortic valve: Secondary | ICD-10-CM | POA: Diagnosis not present

## 2022-12-16 ENCOUNTER — Encounter: Payer: Self-pay | Admitting: Cardiology

## 2022-12-16 ENCOUNTER — Ambulatory Visit: Payer: BC Managed Care – PPO | Admitting: Cardiology

## 2022-12-16 VITALS — BP 138/80 | HR 60 | Resp 18 | Ht 72.0 in | Wt 221.6 lb

## 2022-12-16 DIAGNOSIS — Z8774 Personal history of (corrected) congenital malformations of heart and circulatory system: Secondary | ICD-10-CM | POA: Diagnosis not present

## 2022-12-16 DIAGNOSIS — I10 Essential (primary) hypertension: Secondary | ICD-10-CM | POA: Diagnosis not present

## 2022-12-16 DIAGNOSIS — Q231 Congenital insufficiency of aortic valve: Secondary | ICD-10-CM

## 2022-12-16 MED ORDER — OLMESARTAN MEDOXOMIL 40 MG PO TABS: 40.0000 mg | ORAL_TABLET | Freq: Every day | ORAL | 3 refills | Status: DC

## 2022-12-16 NOTE — Progress Notes (Signed)
Primary Physician/Referring:  Mark Frizzle, MD  Patient ID: Mark Bruce, male    DOB: 01-Jul-1971, 52 y.o.   MRN: [id number]  Chief Complaint  Patient presents with   Hypertension   Follow-up    6 months   bicuspid Aortic valve   HPI:    [name]  is a 52 y.o. AA Male patient with longstanding history of hypertension, bicuspid aortic valve coarctation of the aorta S/P  endograft repair of the coarctation of the aorta with stent implantation at [facility] on 11/02/2021.  He now presents for 37-monthfollow-up.  Remains asymptomatic.  Past Medical History:  Diagnosis Date   Allergy    Hypertension    Past Surgical History:  Procedure Laterality Date   COARCTATION OF AORTA REPAIR     Covered stent repair of distal arch/prox descending thoracic aorta coarctation w/ L SCA coverage on 11/02/21  [facility]   HERNIA REPAIR  06/07/2012   RIH by Dr. LLilyan Punt  Family History  Problem Relation Age of Onset   Hypertension Mother    Cancer Father        Colon cancer     Social History   Tobacco Use   Smoking status: Never   Smokeless tobacco: Never  Substance Use Topics   Alcohol use: Yes    Alcohol/week: 2.0 standard drinks of alcohol    Types: 2 Shots of liquor per week    Comment: OCC   Marital Status: Married  ROS  Review of Systems  Cardiovascular:  Negative for chest pain, dyspnea on exertion and leg swelling.  Gastrointestinal:  Negative for melena.   Objective  Blood pressure 138/80, pulse 60, resp. rate 18, height 6' (1.829 m), weight 221 lb 9.6 oz (100.5 kg), SpO2 97 %. Body mass index is 30.05 kg/m.     12/16/2022    2:47 PM 12/16/2022    1:56 PM 06/13/2022    9:42 AM  Vitals with BMI  Height  '6\' 0"'$    Weight  221 lbs 10 oz   BMI  [id number]  Systolic [id number] Diastolic 80 85 83  Pulse  60 66    Wt Readings from Last 3 Encounters:  12/16/22 221 lb 9.6 oz (100.5 kg)  06/13/22 217 lb (98.4 kg)  12/03/21 215 lb 12.8 oz  (97.9 kg)     Physical Exam Constitutional:      Appearance: He is overweight.  Neck:     Vascular: No carotid bruit or JVD.  Cardiovascular:     Rate and Rhythm: Normal rate and regular rhythm.     Pulses: Normal pulses and intact distal pulses.     Heart sounds: Murmur heard.     Crescendo systolic murmur is present with a grade of 2/6 at the upper left sternal border.     No gallop.  Pulmonary:     Effort: Pulmonary effort is normal.     Breath sounds: Normal breath sounds.  Abdominal:     General: Bowel sounds are normal.     Palpations: Abdomen is soft.  Musculoskeletal:     Right lower leg: No edema.     Left lower leg: No edema.      Laboratory examination:   Lab Results  Component Value Date   NA 138 11/09/2021   K 4.5 11/09/2021   CO2 34 (H) 11/09/2021   GLUCOSE 96 11/09/2021   BUN 9 11/09/2021   CREATININE 0.96 11/09/2021  CALCIUM 10.0 11/09/2021   EGFR 98 11/09/2021   GFRNONAA >60 03/18/2021       Latest Ref Rng & Units 11/09/2021    3:58 PM 03/18/2021    1:16 PM 11/11/2020   10:38 AM  CMP  Glucose 65 - 99 mg/dL 96  116  92   BUN 7 - 25 mg/dL '9  12  12   '$ Creatinine 0.60 - 1.29 mg/dL 0.96  0.94  0.94   Sodium 135 - 146 mmol/L 138  141  136   Potassium 3.5 - 5.3 mmol/L 4.5  3.7  3.8   Chloride 98 - 110 mmol/L 98  104  99   CO2 20 - 32 mmol/L 34  29  29   Calcium 8.6 - 10.3 mg/dL 10.0  9.9  9.6   Total Protein 6.1 - 8.1 g/dL 7.6   7.2   Total Bilirubin 0.2 - 1.2 mg/dL 0.8   1.1   AST 10 - 40 U/L 14   17   ALT 9 - 46 U/L 14   24       Latest Ref Rng & Units 11/09/2021    3:58 PM 03/18/2021    1:16 PM 11/11/2020   10:38 AM  CBC  WBC 3.8 - 10.8 Thousand/uL 5.5  4.8  4.1   Hemoglobin 13.2 - 17.1 g/dL 14.8  15.7  15.5   Hematocrit 38.5 - 50.0 % 43.6  45.4  44.8   Platelets 140 - 400 Thousand/uL 217  199  187     Lipid Panel     Component Value Date/Time   CHOL 168 11/11/2020 1038   TRIG 92 11/11/2020 1038   HDL 49 11/11/2020 1038   CHOLHDL 3.4  11/11/2020 1038   VLDL 17 12/28/2016 0852   LDLCALC 100 (H) 11/11/2020 1038     HEMOGLOBIN A1C Lab Results  Component Value Date   HGBA1C 4.4 06/30/2017   MPG 80 06/30/2017   Lab Results  Component Value Date   TSH 2.021 03/18/2021    Medications and allergies  No Known Allergies   FINAL MEDICATION AS OF TODAY:    Current Outpatient Medications:    aspirin 81 MG chewable tablet, Chew by mouth daily., Disp: , Rfl:    magnesium oxide (MAG-OX) 400 MG tablet, Take 400 mg by mouth daily., Disp: , Rfl:    nebivolol (BYSTOLIC) 5 MG tablet, TAKE 1 TABLET (5 MG TOTAL) BY MOUTH DAILY., Disp: 60 tablet, Rfl: 0   olmesartan (BENICAR) 40 MG tablet, Take 1 tablet (40 mg total) by mouth daily., Disp: 90 tablet, Rfl: 3   Omega-3 Fatty Acids (OMEGA 3 500 PO), Take 500 mg by mouth daily., Disp: , Rfl:    Turmeric (QC TUMERIC COMPLEX PO), Take by mouth., Disp: , Rfl:    VITAMIN D, CHOLECALCIFEROL, PO, Take by mouth., Disp: , Rfl:   Radiology:   CT chest 12/09/2022 comparison 11/30/2021: Unchanged caliber of the thoracic aorta. The proximal arch branch vessels  are widely patent. There is unchanged position of a widely patent proximal  descending thoracic aortic stent with uncovered struts extending beyond the  ostium of the left subclavian artery, which is also widely patent.  Cardiac Studies:   Echocardiogram 04/21/2021:  Left ventricle cavity is normal in size. Moderate concentric hypertrophy  of the left ventricle. Normal global wall motion. Normal LV systolic  function with EF 55%. Doppler evidence of grade I (impaired) diastolic  dysfunction, normal LAP.  Structurally normal trileaflet aortic valve.  No evidence of aortic  stenosis. Mild (Grade I) aortic regurgitation.  Mild (Grade I) mitral regurgitation.  Mild tricuspid regurgitation.  No evidence of pulmonary hypertension.  Coarcted portion of aorta not visualized on this study.  ABI 04/21/2021:  This exam reveals moderately  decreased perfusion of the right lower  extremity, noted at the post tibial artery level (ABI 0.64) and moderately  decreased perfusion of the left lower extremity, noted at the post tibial  artery level (ABI 0.61).  TEE 11/02/2021: Pre TEE:    - Normal Biventricular Function    - Mild AI, Trace MR    - Bicuspid AV with partial L and R Cusp Fusion        Post TEE:    - Normal Biventricular Function    - Mild AI, Trace MR    - Bicuspid AV with partial L and R Cusp Fusion    - Stent in Distal Aortic Arch to Proximal Descending Thoracic Aorta   Coarctation of the aorta repair with endograft 11/02/2021: Covered stent repair of distal arch/prox descending thoracic aorta coarctation w/ L SCA coverage.   PCV CARDIAC STRESS TEST 01/28/2022  The patient exercised for 6 minutes and 36 seconds on Bruce protocol; achieved 7.95 METs at 88% of maximum predicted heart rate. Chest pain is not present. No arrhythmias noted on ECG at stress. The heart rate response was normal. The blood pressure response was normal. Negative for ischemia by treadmill stress EKG. Low risk.   EKG:   EKG 12/16/2022: Normal sinus rhythm at rate of 53 bpm, left axis deviation, left anterior fascicular block.  Incomplete right bundle branch block.  LVH.  Compared to 06/13/2022, no significant change.  Assessment     ICD-10-CM   1. Primary hypertension  I10 olmesartan (BENICAR) 40 MG tablet    2. Bicuspid aortic valve  Q23.1 EKG 12-Lead    3. S/P repair of coarctation of aorta  Z87.74 EKG 12-Lead       Medications Discontinued During This Encounter  Medication Reason   losartan (COZAAR) 100 MG tablet Change in therapy      Meds ordered this encounter  Medications   olmesartan (BENICAR) 40 MG tablet    Sig: Take 1 tablet (40 mg total) by mouth daily.    Dispense:  90 tablet    Refill:  3    Discontinue losartan    Orders Placed This Encounter  Procedures   EKG 12-Lead   Recommendations:   Mark Bruce is a 52 y.o. AA Male patient with longstanding history of hypertension, bicuspid aortic valve coarctation of the aorta S/P  endograft repair of the coarctation of the aorta with stent implantation at [facility] on 11/02/2021.  He now presents for 35-monthfollow-up.  1. Primary hypertension Blood pressure is slightly elevated, [facility] blood pressure was also slightly high.  Hence we will discontinue losartan and switch him to Benicar 40 mg daily.  He did not tolerate HCTZ part of the losartan HCT as it dropped his blood pressure fairly low lead to dizziness.  Continue Bystolic for now.  2. Bicuspid aortic valve Bicuspid aortic valve murmur appears to be stable.  3. S/P repair of coarctation of aorta I reviewed the CT scan of the chest that was performed recently on 12/09/2022, excellent repair/stenting of the coarctation of the aorta.  There is no blood.  Differential between the 2 upper extremities today.  Also there is no further resistant hypertension.  Patient remains asymptomatic.  I will see him back on annual basis.  He will continue to monitor the blood pressure, goal blood pressure <130/80 mmHg.    Adrian Prows, MD, [facility] 12/16/2022, 2:56 PM Office: [phone] Fax: [phone] Pager: [phone]

## 2023-01-03 ENCOUNTER — Other Ambulatory Visit: Payer: Self-pay | Admitting: Family Medicine

## 2023-02-02 ENCOUNTER — Encounter: Payer: Self-pay | Admitting: Family Medicine

## 2023-02-02 ENCOUNTER — Other Ambulatory Visit: Payer: BC Managed Care – PPO

## 2023-02-02 ENCOUNTER — Ambulatory Visit: Payer: BC Managed Care – PPO | Admitting: Family Medicine

## 2023-02-02 VITALS — BP 126/72 | HR 69 | Temp 98.7°F | Ht 72.0 in | Wt 218.6 lb

## 2023-02-02 DIAGNOSIS — I10 Essential (primary) hypertension: Secondary | ICD-10-CM

## 2023-02-02 DIAGNOSIS — Z13220 Encounter for screening for lipoid disorders: Secondary | ICD-10-CM | POA: Diagnosis not present

## 2023-02-02 DIAGNOSIS — Z0001 Encounter for general adult medical examination with abnormal findings: Secondary | ICD-10-CM

## 2023-02-02 DIAGNOSIS — Z6832 Body mass index (BMI) 32.0-32.9, adult: Secondary | ICD-10-CM | POA: Diagnosis not present

## 2023-02-02 DIAGNOSIS — Z0000 Encounter for general adult medical examination without abnormal findings: Secondary | ICD-10-CM

## 2023-02-02 DIAGNOSIS — N401 Enlarged prostate with lower urinary tract symptoms: Secondary | ICD-10-CM | POA: Diagnosis not present

## 2023-02-02 DIAGNOSIS — Z8774 Personal history of (corrected) congenital malformations of heart and circulatory system: Secondary | ICD-10-CM | POA: Diagnosis not present

## 2023-02-02 DIAGNOSIS — Q231 Congenital insufficiency of aortic valve: Secondary | ICD-10-CM | POA: Diagnosis not present

## 2023-02-02 DIAGNOSIS — E6609 Other obesity due to excess calories: Secondary | ICD-10-CM

## 2023-02-02 NOTE — Progress Notes (Signed)
Subjective:    Patient ID: Mark Bruce, male    DOB: [birth year], 52 y.o.   MRN: [id number]  HPI Patient is a very pleasant 52 year old gentleman here today for physical exam.  Patient has a history of a coarctation of the thoracic aorta however he underwent endograft repair of the coarctation of the aorta with stent implantation at [facility] on 11/02/2021.  his next colonoscopy is at age 83.  He has colonoscopies early due to his family history of colon cancer in his father.  He is not yet due for prostate cancer screening.  He is due for fasting lab work.   His blood pressure at home has been between 120 and 140/70.  He denies any chest pain shortness of breath or dyspnea on exertion. Past Medical History:  Diagnosis Date   Allergy    Hypertension    Past Surgical History:  Procedure Laterality Date   COARCTATION OF AORTA REPAIR     Covered stent repair of distal arch/prox descending thoracic aorta coarctation w/ L SCA coverage on 11/02/21  [facility]   HERNIA REPAIR  06/07/2012   RIH by Dr. Biagio Quint   Current Outpatient Medications on File Prior to Visit  Medication Sig Dispense Refill   aspirin 81 MG chewable tablet Chew by mouth daily.     magnesium oxide (MAG-OX) 400 MG tablet Take 400 mg by mouth daily.     nebivolol (BYSTOLIC) 5 MG tablet TAKE 1 TABLET (5 MG TOTAL) BY MOUTH DAILY. 90 tablet 1   olmesartan (BENICAR) 40 MG tablet Take 1 tablet (40 mg total) by mouth daily. 90 tablet 3   Omega-3 Fatty Acids (OMEGA 3 500 PO) Take 500 mg by mouth daily.     Turmeric (QC TUMERIC COMPLEX PO) Take by mouth.     VITAMIN D, CHOLECALCIFEROL, PO Take by mouth.     No current facility-administered medications on file prior to visit.   Marland Kitchenall Social History   Socioeconomic History   Marital status: Married    Spouse name: Not on file   Number of children: 1   Years of education: Not on file   Highest education level: Not on file  Occupational History   Not on file   Tobacco Use   Smoking status: Never   Smokeless tobacco: Never  Vaping Use   Vaping Use: Never used  Substance and Sexual Activity   Alcohol use: Yes    Alcohol/week: 2.0 standard drinks of alcohol    Types: 2 Shots of liquor per week    Comment: OCC   Drug use: No   Sexual activity: Yes  Other Topics Concern   Not on file  Social History Narrative   Not on file   Social Determinants of Health   Financial Resource Strain: Not on file  Food Insecurity: Not on file  Transportation Needs: Not on file  Physical Activity: Not on file  Stress: Not on file  Social Connections: Not on file  Intimate Partner Violence: Not on file     Review of Systems  All other systems reviewed and are negative.      Objective:   Physical Exam Vitals reviewed.  Constitutional:      General: He is not in acute distress.    Appearance: Normal appearance. He is normal weight. He is not ill-appearing or toxic-appearing.  HENT:     Right Ear: Tympanic membrane, ear canal and external ear normal.     Left Ear: Tympanic  membrane, ear canal and external ear normal.     Nose: Nose normal. No congestion or rhinorrhea.     Mouth/Throat:     Mouth: Mucous membranes are moist.     Pharynx: Oropharynx is clear. No oropharyngeal exudate or posterior oropharyngeal erythema.  Eyes:     Extraocular Movements: Extraocular movements intact.     Conjunctiva/sclera: Conjunctivae normal.     Pupils: Pupils are equal, round, and reactive to light.  Neck:     Vascular: No carotid bruit.  Cardiovascular:     Rate and Rhythm: Normal rate and regular rhythm.     Pulses: Normal pulses.     Heart sounds: Normal heart sounds. No murmur heard.    No friction rub. No gallop.  Pulmonary:     Effort: Pulmonary effort is normal. No respiratory distress.     Breath sounds: Normal breath sounds. No wheezing, rhonchi or rales.  Abdominal:     General: Bowel sounds are normal. There is no distension.     Palpations:  Abdomen is soft.     Tenderness: There is no abdominal tenderness. There is no guarding or rebound.  Musculoskeletal:     Cervical back: Normal range of motion and neck supple. No rigidity.     Right lower leg: No edema.     Left lower leg: No edema.  Lymphadenopathy:     Cervical: No cervical adenopathy.  Skin:    General: Skin is warm.     Findings: No rash.  Neurological:     General: No focal deficit present.     Mental Status: He is alert and oriented to person, place, and time. Mental status is at baseline.     Cranial Nerves: No cranial nerve deficit.     Sensory: No sensory deficit.     Motor: No weakness.     Coordination: Coordination normal.     Gait: Gait normal.  Psychiatric:        Mood and Affect: Mood normal.        Behavior: Behavior normal.        Thought Content: Thought content normal.           Assessment & Plan:  General medical exam  Benign essential HTN Blood pressure is excellent.  Check CBC CMP lipid panel.  PSA is not due until next year.  Colonoscopy is due next year.  Discussed vaccines.  Patient declines hepatitis B vaccination

## 2023-02-03 LAB — COMPLETE METABOLIC PANEL WITH GFR
AG Ratio: 1.4 (calc) (ref 1.0–2.5)
ALT: 24 U/L (ref 9–46)
AST: 18 U/L (ref 10–40)
Albumin: 4.3 g/dL (ref 3.6–5.1)
Alkaline phosphatase (APISO): 56 U/L (ref 36–130)
BUN: 13 mg/dL (ref 7–25)
CO2: 29 mmol/L (ref 20–32)
Calcium: 9.4 mg/dL (ref 8.6–10.3)
Chloride: 103 mmol/L (ref 98–110)
Creat: 0.93 mg/dL (ref 0.60–1.29)
Globulin: 3 g/dL (calc) (ref 1.9–3.7)
Glucose, Bld: 97 mg/dL (ref 65–99)
Potassium: 4 mmol/L (ref 3.5–5.3)
Sodium: 139 mmol/L (ref 135–146)
Total Bilirubin: 1.5 mg/dL — ABNORMAL HIGH (ref 0.2–1.2)
Total Protein: 7.3 g/dL (ref 6.1–8.1)
eGFR: 101 mL/min/{1.73_m2} (ref >=60)

## 2023-02-03 LAB — LIPID PANEL
Cholesterol: 159 mg/dL (ref <200)
HDL: 53 mg/dL (ref >=40)
LDL Cholesterol (Calc): 90 mg/dL (calc)
Non-HDL Cholesterol (Calc): 106 mg/dL (calc) (ref <130)
Total CHOL/HDL Ratio: 3 (calc) (ref <5.0)
Triglycerides: 73 mg/dL (ref <150)

## 2023-02-03 LAB — CBC WITH DIFFERENTIAL/PLATELET
Absolute Monocytes: 594 cells/uL (ref 200–950)
Basophils Absolute: 48 cells/uL (ref 0–200)
Basophils Relative: 1.1 %
Eosinophils Absolute: 189 cells/uL (ref 15–500)
Eosinophils Relative: 4.3 %
HCT: 45.5 % (ref 38.5–50.0)
Hemoglobin: 15.3 g/dL (ref 13.2–17.1)
Lymphs Abs: 1597 cells/uL (ref 850–3900)
MCH: 35 pg — ABNORMAL HIGH (ref 27.0–33.0)
MCHC: 33.6 g/dL (ref 32.0–36.0)
MCV: 104.1 fL — ABNORMAL HIGH (ref 80.0–100.0)
MPV: 10.2 fL (ref 7.5–12.5)
Monocytes Relative: 13.5 %
Neutro Abs: 1971 cells/uL (ref 1500–7800)
Neutrophils Relative %: 44.8 %
Platelets: 163 10*3/uL (ref 140–400)
RBC: 4.37 10*6/uL (ref 4.20–5.80)
RDW: 10.8 % — ABNORMAL LOW (ref 11.0–15.0)
Total Lymphocyte: 36.3 %
WBC: 4.4 10*3/uL (ref 3.8–10.8)

## 2023-02-03 LAB — PSA: PSA: 1.9 ng/mL (ref <=4.00)

## 2023-05-15 ENCOUNTER — Telehealth: Payer: Self-pay

## 2023-05-15 NOTE — Telephone Encounter (Signed)
Patient states he has been feeling "weird". He has been feeling "a heart beat in his stomach." Patient states only when he lays flat. What should he do

## 2023-05-15 NOTE — Telephone Encounter (Signed)
Wait and watch for a few days and can come see [place] or his PCP

## 2023-05-16 NOTE — Telephone Encounter (Signed)
Mark Bruce, please schedule, I do not need to be informed, I will see him anytime, it is not urgent or stat.

## 2023-05-16 NOTE — Telephone Encounter (Signed)
Pt states next appointment is not until September and he wants to see you.

## 2023-06-06 ENCOUNTER — Telehealth: Payer: Self-pay | Admitting: Family Medicine

## 2023-06-06 ENCOUNTER — Other Ambulatory Visit: Payer: Self-pay

## 2023-06-06 DIAGNOSIS — Z1211 Encounter for screening for malignant neoplasm of colon: Secondary | ICD-10-CM

## 2023-06-06 DIAGNOSIS — Z86010 Personal history of colonic polyps: Secondary | ICD-10-CM

## 2023-06-06 NOTE — Telephone Encounter (Signed)
Patient called to request a referral for a colonoscopy.   LOV 02/02/23 (CPE)  Please advise at [phone].

## 2023-09-29 ENCOUNTER — Other Ambulatory Visit: Payer: Self-pay

## 2023-09-29 ENCOUNTER — Telehealth: Payer: Self-pay

## 2023-09-29 DIAGNOSIS — I10 Essential (primary) hypertension: Secondary | ICD-10-CM

## 2023-09-29 MED ORDER — NEBIVOLOL HCL 5 MG PO TABS: 5.0000 mg | ORAL_TABLET | Freq: Every day | ORAL | 1 refills | Status: DC

## 2023-09-29 NOTE — Telephone Encounter (Signed)
Copied from CRM [id number]. Topic: Clinical - Medication Refill >> Sep 29, 2023  8:30 AM Fonda Kinder J wrote: Most Recent Primary Care Visit:  Provider: [facility]  Department: [facility]  Visit Type: LAB  Date: 02/02/2023  Medication: nebivolol (BYSTOLIC)   Has the patient contacted their pharmacy? Yes (Agent: If no, request that the patient contact the pharmacy for the refill. If patient does not wish to contact the pharmacy document the reason why and proceed with request.) (Agent: If yes, when and what did the pharmacy advise?) Contact PCP  Is this the correct pharmacy for this prescription? Yes If no, delete pharmacy and type the correct one.  This is the patient's preferred pharmacy:  [id number] [place], [place] - [address] [place] [address] Phone: [phone] Fax: [phone]    Has the prescription been filled recently? No  Is the patient out of the medication? Yes  Has the patient been seen for an appointment in the last year OR does the patient have an upcoming appointment? Yes  Can we respond through MyChart? No  Agent: Please be advised that Rx refills may take up to 3 business days. We ask that you follow-up with your pharmacy.

## 2023-10-01 ENCOUNTER — Other Ambulatory Visit: Payer: Self-pay | Admitting: Cardiology

## 2023-10-01 DIAGNOSIS — I10 Essential (primary) hypertension: Secondary | ICD-10-CM

## 2023-10-01 DIAGNOSIS — Z8774 Personal history of (corrected) congenital malformations of heart and circulatory system: Secondary | ICD-10-CM

## 2023-12-18 DIAGNOSIS — Z1211 Encounter for screening for malignant neoplasm of colon: Secondary | ICD-10-CM | POA: Diagnosis not present

## 2024-01-02 ENCOUNTER — Telehealth: Payer: Self-pay | Admitting: Cardiology

## 2024-01-02 DIAGNOSIS — Z8774 Personal history of (corrected) congenital malformations of heart and circulatory system: Secondary | ICD-10-CM

## 2024-01-02 DIAGNOSIS — I10 Essential (primary) hypertension: Secondary | ICD-10-CM

## 2024-01-02 MED ORDER — LOSARTAN POTASSIUM 100 MG PO TABS: 100.0000 mg | ORAL_TABLET | Freq: Every day | ORAL | 3 refills | Status: DC

## 2024-01-02 NOTE — Telephone Encounter (Signed)
 ICD-10-CM   1. Primary hypertension  I10 losartan (COZAAR) 100 MG tablet    2. S/P repair of coarctation of aorta  Z87.74 losartan (COZAAR) 100 MG tablet     Meds ordered this encounter  Medications   losartan (COZAAR) 100 MG tablet    Sig: Take 1 tablet (100 mg total) by mouth daily.    Dispense:  90 tablet    Refill:  3   Medications Discontinued During This Encounter  Medication Reason   olmesartan (BENICAR) 40 MG tablet Change in therapy

## 2024-01-02 NOTE — Telephone Encounter (Signed)
 I spoke with patient and let him know losartan prescription had been sent to pharmacy

## 2024-01-02 NOTE — Telephone Encounter (Signed)
 Pt c/o medication issue:  1. Name of Medication:   olmesartan (BENICAR) 40 MG tablet   2. How are you currently taking this medication (dosage and times per day)?   Stopped taking  3. Are you having a reaction (difficulty breathing--STAT)?   4. What is your medication issue?   Patient stated he had stopped taking this medication because it was not working for him and he re-started taking losartan-hydrochlorothiazide .  Patient stated he is now out of the losartan and wants a new prescription [facility] to [id number] [place], [place] - [address]

## 2024-01-02 NOTE — Telephone Encounter (Signed)
 Spoke with patient and he states olmesartan was not working for him. He has been taking losartan 100 mg. He would like a new Rx for losaratan 100 mg daily

## 2024-01-02 NOTE — Telephone Encounter (Signed)
 Patient was returning call. Please advise ?

## 2024-01-02 NOTE — Telephone Encounter (Signed)
 Left voicemail to return call to office

## 2024-01-04 ENCOUNTER — Encounter: Payer: Self-pay | Admitting: Family Medicine

## 2024-01-04 DIAGNOSIS — Z860101 Personal history of adenomatous and serrated colon polyps: Secondary | ICD-10-CM | POA: Diagnosis not present

## 2024-01-04 DIAGNOSIS — Z800 Family history of malignant neoplasm of digestive organs: Secondary | ICD-10-CM | POA: Diagnosis not present

## 2024-01-04 DIAGNOSIS — K562 Volvulus: Secondary | ICD-10-CM | POA: Diagnosis not present

## 2024-01-04 DIAGNOSIS — Z1211 Encounter for screening for malignant neoplasm of colon: Secondary | ICD-10-CM | POA: Diagnosis not present

## 2024-01-04 LAB — HM COLONOSCOPY

## 2024-02-02 ENCOUNTER — Encounter: Payer: Self-pay | Admitting: Family Medicine

## 2024-02-02 ENCOUNTER — Ambulatory Visit: Payer: BC Managed Care – PPO | Admitting: Family Medicine

## 2024-02-02 ENCOUNTER — Encounter: Payer: Self-pay | Admitting: Cardiology

## 2024-02-02 ENCOUNTER — Ambulatory Visit: Attending: Cardiology | Admitting: Cardiology

## 2024-02-02 VITALS — BP 132/84 | HR 67 | Ht 72.0 in | Wt 231.8 lb

## 2024-02-02 VITALS — BP 138/82 | HR 62 | Temp 98.2°F | Ht 72.0 in | Wt 233.4 lb

## 2024-02-02 DIAGNOSIS — Z23 Encounter for immunization: Secondary | ICD-10-CM

## 2024-02-02 DIAGNOSIS — I10 Essential (primary) hypertension: Secondary | ICD-10-CM

## 2024-02-02 DIAGNOSIS — Q2381 Bicuspid aortic valve: Secondary | ICD-10-CM | POA: Diagnosis not present

## 2024-02-02 DIAGNOSIS — Z125 Encounter for screening for malignant neoplasm of prostate: Secondary | ICD-10-CM | POA: Diagnosis not present

## 2024-02-02 DIAGNOSIS — Z860100 Personal history of colon polyps, unspecified: Secondary | ICD-10-CM

## 2024-02-02 DIAGNOSIS — Q251 Coarctation of aorta: Secondary | ICD-10-CM | POA: Diagnosis not present

## 2024-02-02 DIAGNOSIS — Z1211 Encounter for screening for malignant neoplasm of colon: Secondary | ICD-10-CM

## 2024-02-02 DIAGNOSIS — Z0000 Encounter for general adult medical examination without abnormal findings: Secondary | ICD-10-CM

## 2024-02-02 MED ORDER — LORATADINE 10 MG PO TABS: 10.0000 mg | ORAL_TABLET | Freq: Every day | ORAL | 11 refills | Status: AC

## 2024-02-02 NOTE — Patient Instructions (Signed)
 Medication Instructions:   Your physician recommends that you continue on your current medications as directed. Please refer to the Current Medication list given to you today.   *If you need a refill on your cardiac medications before your next appoin

## 2024-02-02 NOTE — Progress Notes (Signed)
 Cardiology Office Note:   Date:  02/02/2024  ID:  TASMAN ZAPATA, DOB 05/14/1971, MRN [id number] PCP: Austine Lefort, MD  [facility] Providers Cardiologist:  None    History of Present Illness:   Discussed the use of AI scribe software for clinical note transcription with the patient, who gave verbal consent to proceed.  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  He has a long-standing history of hypertension, managed since the age of 45. Blood pressure readings are higher in the mornings, reaching the 140s, and decrease to the 120s in the evenings. Home monitoring showed a recent reading of 138/82 in the morning. Current medications include losartan  100 mg in the morning and nebivolol  5 mg at night. He wants to reduce medication use and recalls low blood pressure with Benicar , which contains hydrochlorothiazide.  He has a history of bicuspid aortic valve and coarctation of the aorta, treated with endograft repair and stent implantation at [facility]. The last CT scan in February 2024 showed stable repair and stenting, with a follow-up CT advised for February 2027. He feels well and remains active without limitations.  Family history includes hypertension, with his mother affected. 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 He acknowledges the impact of his diet on blood pressure and is willing to make dietary changes to reduce salt intake.  No swelling in the legs, chest pain, shortness of breath, palpitations, skipped heartbeats, or sleep disturbances.  Today patient denies chest pain, shortness of breath, lower extremity edema, fatigue, palpitations, melena, hematuria, hemoptysis, diaphoresis,  weakness, presyncope, syncope, orthopnea, and PND.   Studies Reviewed:    EKG:   EKG Interpretation Date/Time:  Friday February 02 2024 14:20:45 EDT Ventricular Rate:  67 PR Interval:  172 QRS Duration:  110 QT Interval:  416 QTC Calculation: 439 R Axis:   -34  Text Interpretation: Normal sinus rhythm Left axis deviation Left ventricular hypertrophy ( R in aVL , Cornell product , Romhilt-Estes ) When compared with ECG of 18-Mar-2021 12:55, PREVIOUS ECG IS PRESENT Confirmed by Leala Prince [phone]) on 02/02/2024 2:50:00 PM    12/09/22 CTA chest ([facility]) FINDINGS:   VASCULAR:  The left ventricle and left atrium opacify normally with contrast. Both  ventricles and atria are normal sized. No pericardial effusion or  thickening. No coronary artery calcifications.   Unchanged caliber of the thoracic aorta. The proximal arch branch vessels  are widely patent. There is unchanged position of a widely patent proximal  descending thoracic aortic stent with uncovered struts extending beyond the  ostium of the left subclavian artery, which is also widely patent.   NONVASCULAR:  Normal neck base.  No suspicious pulmonary nodule or mass. No pleural effusion. Patent central  airways. No suspicious lytic or blastic osseous lesion. The upper abdomen  is normal.   IMPRESSION:  Stably positioned, widely patent proximal descending thoracic aortic stent.    Risk Assessment/Calculations:              Physical Exam:   VS:  BP 132/84   Pulse 67   Ht 6' (1.829 m)   Wt 231 lb 12.8 oz (105.1 kg)   SpO2 96%   BMI 31.44 kg/m    Wt Readings from  Last 3 Encounters:  02/02/24 231 lb 12.8 oz (105.1 kg)  02/02/24 233 lb 6.4 oz (105.9 kg)  02/02/23 218 lb 9.6 oz (99.2 kg)     Physical Exam Vitals reviewed.  Constitutional:      Appearance: Normal appearance.  HENT:     Head: Normocephalic.  Eyes:     Pupils: Pupils are equal, round, and reactive to light.  Cardiovascular:     Rate and  Rhythm: Normal rate and regular rhythm.     Pulses: Normal pulses.     Heart sounds: Murmur (faint systolic) heard.  Pulmonary:     Effort: Pulmonary effort is normal.     Breath sounds: Normal breath sounds.  Abdominal:     General: Abdomen is flat.     Palpations: Abdomen is soft.  Musculoskeletal:     Right lower leg: No edema.     Left lower leg: No edema.  Skin:    General: Skin is warm and dry.     Capillary Refill: Capillary refill takes less than 2 seconds.  Neurological:     General: No focal deficit present.     Mental Status: He is alert and oriented to person, place, and time.  Psychiatric:        Mood and Affect: Mood normal.        Behavior: Behavior normal.        Thought Content: Thought content normal.        Judgment: Judgment normal.     ASSESSMENT AND PLAN:    Assessment & Plan Bicuspid Aortic Valve and Coarctation of the Aorta, Status Post Endograft Repair Bicuspid aortic valve and coarctation of the aorta, status post endograft repair with stent implantation. Last CT scan in February 2024 showed stable repair. No current symptoms related to the aorta or valve. Next CT imaging recommended in February 2027. Regular follow-up with cardiothoracic surgeon is advised to monitor for any changes. - Plan for repeat CT imaging in February 2027 per CT surgery guidance  Hypertension Long-standing hypertension currently managed with losartan  and nebivolol . Blood pressure shows morning elevations up to 140s, evening readings in the 120s. Current blood pressure is 132/84. Discussed potential addition of hydrochlorothiazide at lower doses than previously tried for better control of morning elevations, but he prefers to minimize medication use. Discussed dietary modifications to reduce salt intake as an alternative. If morning blood pressure consistently exceeds 140/90, medication adjustment may be necessary.  - Continue losartan  100 mg daily - Continue nebivolol  5 mg  daily - Monitor blood pressure at home, especially morning readings - Consider adding hydrochlorothiazide 12.5 mg if morning blood pressure consistently exceeds 140/90 - Encourage dietary modifications to reduce salt intake         Signed, Leala Prince, PA-C

## 2024-02-02 NOTE — Addendum Note (Signed)
 Addended by: Gillermo Lack K on: 02/02/2024 10:16 AM   Modules accepted: Orders

## 2024-02-02 NOTE — Progress Notes (Signed)
 Subjective:    Patient ID: Mark Bruce, male    DOB: 08-25-1971, 53 y.o.   MRN: [id number]  HPI Patient is a very pleasant 53 year old gentleman here today for physical exam.  Patient has a history of a coarctation of the thoracic aorta however he underwent endograft repair of the coarctation of the aorta with stent implantation at [facility] on 11/02/2021.  He has family history of colon cancer in his father. Had colonoscopy 3/25 which was normal.  Due for prostate cancer screen.  He does report congestion due to seasonal allergies.  He also request the pneumonia vaccine today.  He had the first dose of the shingles vaccine in February.  His pharmacy is going to contact him when he is due for the second dose.  He denies any claudication in his legs with exercise.  He denies any claudication in his arms with exercise.  He denies any chest pain or shortness of breath or dyspnea on exertion Past Medical History:  Diagnosis Date   Allergy    Hypertension    Past Surgical History:  Procedure Laterality Date   COARCTATION OF AORTA REPAIR     Covered stent repair of distal arch/prox descending thoracic aorta coarctation w/ L SCA coverage on 11/02/21  [facility]   HERNIA REPAIR  06/07/2012   [facility] by Dr. Elvin   Current Outpatient Medications on File Prior to Visit  Medication Sig Dispense Refill   aspirin 81 MG chewable tablet Chew by mouth daily.     losartan  (COZAAR ) 100 MG tablet Take 1 tablet (100 mg total) by mouth daily. 90 tablet 3   magnesium oxide (MAG-OX) 400 MG tablet Take 400 mg by mouth daily.     nebivolol  (BYSTOLIC ) 5 MG tablet Take 1 tablet (5 mg total) by mouth daily. 90 tablet 1   Omega-3 Fatty Acids (OMEGA 3 500 PO) Take 500 mg by mouth daily.     Turmeric (QC TUMERIC COMPLEX PO) Take by mouth.     VITAMIN D, CHOLECALCIFEROL, PO Take by mouth.     No current facility-administered medications on file prior to visit.   No Known Allergies  Social  History   Socioeconomic History   Marital status: Married    Spouse name: Not on file   Number of children: 1   Years of education: Not on file   Highest education level: 12th grade  Occupational History   Not on file  Tobacco Use   Smoking status: Never   Smokeless tobacco: Never  Vaping Use   Vaping status: Never Used  Substance and Sexual Activity   Alcohol use: Yes    Alcohol/week: 2.0 standard drinks of alcohol    Types: 2 Shots of liquor per week    Comment: OCC   Drug use: No   Sexual activity: Yes  Other Topics Concern   Not on file  Social History Narrative   Not on file   Social Drivers of Health   Financial Resource Strain: Low Risk  (01/29/2024)   Overall Financial Resource Strain (CARDIA)    Difficulty of Paying Living Expenses: Not very hard  Food Insecurity: Patient Declined (01/29/2024)   Hunger Vital Sign    Worried About Running Out of Food in the Last Year: Patient declined    Ran Out of Food in the Last Year: Patient declined  Transportation Needs: No Transportation Needs (01/29/2024)   PRAPARE - Administrator, Civil Service (Medical): No  Lack of Transportation (Non-Medical): No  Physical Activity: Unknown (01/29/2024)   Exercise Vital Sign    Days of Exercise per Week: 0 days    Minutes of Exercise per Session: Not on file  Stress: No Stress Concern Present (01/29/2024)   [employer] of Occupational Health - Occupational Stress Questionnaire    Feeling of Stress : Only a little  Social Connections: Moderately Integrated (01/29/2024)   Social Connection and Isolation Panel [NHANES]    Frequency of Communication with Friends and Family: Three times a week    Frequency of Social Gatherings with Friends and Family: Once a week    Attends Religious Services: More than 4 times per year    Active Member of [employer] or Organizations: No    Attends Engineer, Structural: Not on file    Marital Status: Married  Intimate Partner  Violence: Unknown (01/20/2022)   Received from [employer], [facility]   HITS    Physically Hurt: Not on file    Insult or Talk Down To: Not on file    Threaten Physical Harm: Not on file    Scream or Curse: Not on file     Review of Systems  All other systems reviewed and are negative.      Objective:   Physical Exam Vitals reviewed.  Constitutional:      General: He is not in acute distress.    Appearance: Normal appearance. He is normal weight. He is not ill-appearing or toxic-appearing.  HENT:     Right Ear: Tympanic membrane, ear canal and external ear normal.     Left Ear: Tympanic membrane, ear canal and external ear normal.     Nose: Nose normal. No congestion or rhinorrhea.     Mouth/Throat:     Mouth: Mucous membranes are moist.     Pharynx: Oropharynx is clear. No oropharyngeal exudate or posterior oropharyngeal erythema.  Eyes:     Extraocular Movements: Extraocular movements intact.     Conjunctiva/sclera: Conjunctivae normal.     Pupils: Pupils are equal, round, and reactive to light.  Neck:     Vascular: No carotid bruit.  Cardiovascular:     Rate and Rhythm: Normal rate and regular rhythm.     Pulses: Normal pulses.     Heart sounds: Normal heart sounds. No murmur heard.    No friction rub. No gallop.  Pulmonary:     Effort: Pulmonary effort is normal. No respiratory distress.     Breath sounds: Normal breath sounds. No wheezing, rhonchi or rales.  Abdominal:     General: Bowel sounds are normal. There is no distension.     Palpations: Abdomen is soft.     Tenderness: There is no abdominal tenderness. There is no guarding or rebound.  Musculoskeletal:     Cervical back: Normal range of motion and neck supple. No rigidity.     Right lower leg: No edema.     Left lower leg: No edema.  Lymphadenopathy:     Cervical: No cervical adenopathy.  Skin:    General: Skin is warm.     Findings: No rash.  Neurological:     General: No focal deficit  present.     Mental Status: He is alert and oriented to person, place, and time. Mental status is at baseline.     Cranial Nerves: No cranial nerve deficit.     Sensory: No sensory deficit.     Motor: No weakness.     Coordination:  Coordination normal.     Gait: Gait normal.  Psychiatric:        Mood and Affect: Mood normal.        Behavior: Behavior normal.        Thought Content: Thought content normal.           Assessment & Plan:  General medical exam - Plan: CBC with Differential/Platelet, COMPLETE METABOLIC PANEL WITHOUT GFR, Lipid panel, PSA  Screen for colon cancer  Benign essential HTN - Plan: CBC with Differential/Platelet, COMPLETE METABOLIC PANEL WITHOUT GFR, Lipid panel  History of colonic polyps  Prostate cancer screening - Plan: PSA Patient received Prevnar 20 today.  He will receive the second dose of his shingles vaccine at his pharmacy.  Will start loratadine  10 mg a day for allergies.  He can use this as needed.  Colonoscopy is up-to-date.  Check a PSA to screen for prostate cancer.  Check CBC, CMP, fasting lipid panel.  Goal cholesterol is less than 100.  Recommended that he monitor his blood pressure at home.  If his systolic blood pressures consistently greater than 140 I would switch losartan  to Hyzaar.  At the present time the patient does not want to make any changes.  Blood pressure is borderline today.

## 2024-02-03 LAB — CBC WITH DIFFERENTIAL/PLATELET
Absolute Lymphocytes: 1554 {cells}/uL (ref 850–3900)
Absolute Monocytes: 549 {cells}/uL (ref 200–950)
Basophils Absolute: 49 {cells}/uL (ref 0–200)
Basophils Relative: 1.2 %
Eosinophils Absolute: 230 {cells}/uL (ref 15–500)
Eosinophils Relative: 5.6 %
HCT: 44.5 % (ref 38.5–50.0)
Hemoglobin: 15.3 g/dL (ref 13.2–17.1)
MCH: 35.3 pg — ABNORMAL HIGH (ref 27.0–33.0)
MCHC: 34.4 g/dL (ref 32.0–36.0)
MCV: 102.8 fL — ABNORMAL HIGH (ref 80.0–100.0)
MPV: 10.7 fL (ref 7.5–12.5)
Monocytes Relative: 13.4 %
Neutro Abs: 1718 {cells}/uL (ref 1500–7800)
Neutrophils Relative %: 41.9 %
Platelets: 173 10*3/uL (ref 140–400)
RBC: 4.33 10*6/uL (ref 4.20–5.80)
RDW: 10.8 % — ABNORMAL LOW (ref 11.0–15.0)
Total Lymphocyte: 37.9 %
WBC: 4.1 10*3/uL (ref 3.8–10.8)

## 2024-02-03 LAB — COMPLETE METABOLIC PANEL WITHOUT GFR
AG Ratio: 1.5 (calc) (ref 1.0–2.5)
ALT: 24 U/L (ref 9–46)
AST: 18 U/L (ref 10–35)
Albumin: 4.4 g/dL (ref 3.6–5.1)
Alkaline phosphatase (APISO): 54 U/L (ref 35–144)
BUN: 10 mg/dL (ref 7–25)
CO2: 25 mmol/L (ref 20–32)
Calcium: 9.7 mg/dL (ref 8.6–10.3)
Chloride: 102 mmol/L (ref 98–110)
Creat: 0.93 mg/dL (ref 0.70–1.30)
Globulin: 3 g/dL (ref 1.9–3.7)
Glucose, Bld: 95 mg/dL (ref 65–99)
Potassium: 4.3 mmol/L (ref 3.5–5.3)
Sodium: 137 mmol/L (ref 135–146)
Total Bilirubin: 1.3 mg/dL — ABNORMAL HIGH (ref 0.2–1.2)
Total Protein: 7.4 g/dL (ref 6.1–8.1)

## 2024-02-03 LAB — LIPID PANEL
Cholesterol: 158 mg/dL (ref <200)
HDL: 50 mg/dL (ref >=40)
LDL Cholesterol (Calc): 91 mg/dL
Non-HDL Cholesterol (Calc): 108 mg/dL (ref <130)
Total CHOL/HDL Ratio: 3.2 (calc) (ref <5.0)
Triglycerides: 77 mg/dL (ref <150)

## 2024-02-03 LAB — PSA: PSA: 2.17 ng/mL (ref <=4.00)

## 2024-03-27 ENCOUNTER — Other Ambulatory Visit: Payer: Self-pay | Admitting: Family Medicine

## 2024-03-27 DIAGNOSIS — I10 Essential (primary) hypertension: Secondary | ICD-10-CM

## 2024-08-09 ENCOUNTER — Ambulatory Visit: Admitting: Family Medicine

## 2024-08-12 ENCOUNTER — Telehealth: Payer: Self-pay

## 2024-08-12 NOTE — Telephone Encounter (Signed)
 Copied from CRM [id number]. Topic: Clinical - Medication Question >> Aug 12, 2024 12:13 PM Emylou G wrote: Reason for CRM: For Dr Breck nurse.SABRA Pls call patient.. He wouldn't relay details to me.

## 2024-08-13 ENCOUNTER — Encounter: Payer: Self-pay | Admitting: Family Medicine

## 2024-08-13 ENCOUNTER — Ambulatory Visit (INDEPENDENT_AMBULATORY_CARE_PROVIDER_SITE_OTHER): Admitting: Family Medicine

## 2024-08-13 VITALS — BP 130/80 | HR 62 | Temp 98.2°F | Ht 72.0 in | Wt 247.0 lb

## 2024-08-13 DIAGNOSIS — T485X5A Adverse effect of other anti-common-cold drugs, initial encounter: Secondary | ICD-10-CM

## 2024-08-13 DIAGNOSIS — J310 Chronic rhinitis: Secondary | ICD-10-CM | POA: Diagnosis not present

## 2024-08-13 MED ORDER — AMOXICILLIN 875 MG PO TABS: 875.0000 mg | ORAL_TABLET | Freq: Two times a day (BID) | ORAL | 0 refills | Status: AC

## 2024-08-13 MED ORDER — PREDNISONE 20 MG PO TABS: ORAL_TABLET | ORAL | 0 refills | Status: AC

## 2024-08-13 NOTE — Progress Notes (Signed)
 Subjective:    Patient ID: Mark Bruce, male    DOB: 1971/03/24, 53 y.o.   MRN: [id number]  HPI Patient believes he has a sinus infection.  He states that he feels pressure in both maxillary sinuses.  This been going on for 4 weeks.  He feels like he cannot breathe through his nose.  He feels congested however he is not able to blow any snot or mucus out of his nose.  He denies any fevers or chills.  He denies any headache.  He does have some mild discomfort bilaterally at the angle of the mandible but no pain in his ears or decreased hearing.  Symptoms been going on for 4 weeks.  They improved when he uses a nasal spray and then after 12 hours the symptoms come back.  He is uncertain of the name of the nasal spray that he is dealing with. Past Medical History:  Diagnosis Date   Allergy    Hypertension    Past Surgical History:  Procedure Laterality Date   COARCTATION OF AORTA REPAIR     Covered stent repair of distal arch/prox descending thoracic aorta coarctation w/ L SCA coverage on 11/02/21  [facility]   HERNIA REPAIR  06/07/2012   [facility] by Dr. Elvin   Current Outpatient Medications on File Prior to Visit  Medication Sig Dispense Refill   aspirin 81 MG chewable tablet Chew by mouth daily.     loratadine  (CLARITIN ) 10 MG tablet Take 1 tablet (10 mg total) by mouth daily. 30 tablet 11   losartan  (COZAAR ) 100 MG tablet Take 1 tablet (100 mg total) by mouth daily. 90 tablet 3   nebivolol  (BYSTOLIC ) 5 MG tablet TAKE 1 TABLET (5 MG TOTAL) BY MOUTH DAILY. 90 tablet 1   Turmeric (QC TUMERIC COMPLEX PO) Take by mouth.     No current facility-administered medications on file prior to visit.   SABRAall Social History   Socioeconomic History   Marital status: Married    Spouse name: Not on file   Number of children: 1   Years of education: Not on file   Highest education level: Some college, no degree  Occupational History   Not on file  Tobacco Use   Smoking status: Never   Smokeless  tobacco: Never  Vaping Use   Vaping status: Never Used  Substance and Sexual Activity   Alcohol use: Yes    Alcohol/week: 2.0 standard drinks of alcohol    Types: 2 Shots of liquor per week    Comment: OCC   Drug use: No   Sexual activity: Yes  Other Topics Concern   Not on file  Social History Narrative   Not on file   Social Drivers of Health   Financial Resource Strain: Patient Declined (08/12/2024)   Overall Financial Resource Strain (CARDIA)    Difficulty of Paying Living Expenses: Patient declined  Food Insecurity: Unknown (08/12/2024)   Hunger Vital Sign    Worried About Running Out of Food in the Last Year: Not on file    Ran Out of Food in the Last Year: Patient declined  Transportation Needs: No Transportation Needs (08/12/2024)   PRAPARE - Administrator, Civil Service (Medical): No    Lack of Transportation (Non-Medical): No  Physical Activity: Insufficiently Active (08/12/2024)   Exercise Vital Sign    Days of Exercise per Week: 3 days    Minutes of Exercise per Session: 20 min  Stress: No Stress Concern Present (  08/12/2024)   [employer]ational [employer] - Occupational Stress Questionnaire    Feeling of Stress: Not at all  Social Connections: Moderately Integrated (08/12/2024)   Social Connection and Isolation Panel    Frequency of Communication with Friends and Family: Three times a week    Frequency of Social Gatherings with Friends and Family: Once a week    Attends Religious Services: More than 4 times per year    Active Member of [employer] or Organizations: No    Attends Engineer, Structural: Not on file    Marital Status: Married  Intimate Partner Violence: Unknown (01/20/2022)   Received from [facility]   HITS    Physically Hurt: Not on file    Insult or Talk Down To: Not on file    Threaten Physical Harm: Not on file    Scream or Curse: Not on file     Review of Systems  All other systems reviewed and are  negative.      Objective:   Physical Exam Vitals reviewed.  Constitutional:      Appearance: Normal appearance. He is normal weight.  HENT:     Right Ear: Tympanic membrane and ear canal normal.     Left Ear: Tympanic membrane and ear canal normal.     Nose: No congestion or rhinorrhea.     Right Turbinates: Enlarged and swollen.     Left Turbinates: Enlarged and swollen.     Right Sinus: No maxillary sinus tenderness or frontal sinus tenderness.     Left Sinus: No maxillary sinus tenderness or frontal sinus tenderness.     Mouth/Throat:     Pharynx: No oropharyngeal exudate or posterior oropharyngeal erythema.  Cardiovascular:     Rate and Rhythm: Normal rate and regular rhythm.     Heart sounds: Normal heart sounds.  Pulmonary:     Effort: Pulmonary effort is normal.     Breath sounds: Normal breath sounds.  Lymphadenopathy:     Cervical: No cervical adenopathy.  Neurological:     Mental Status: He is alert.           Assessment & Plan:  Rhinitis medicamentosa I suspect the patient is using Afrin.  Recommended discontinuation of this immediately as this is likely causing his symptoms.  Recommended using a prednisone taper pack to reduce the rebound congestion that is going to occur.  Anticipate symptoms will improve over 4 to 5 days off of the Afrin.  If he develops fever or worsening pain in his maxillary sinuses he can start amoxicillin but I feel that this is not likely a sinus infection

## 2024-09-30 ENCOUNTER — Other Ambulatory Visit: Payer: Self-pay

## 2024-09-30 ENCOUNTER — Telehealth: Payer: Self-pay | Admitting: Family Medicine

## 2024-09-30 DIAGNOSIS — I10 Essential (primary) hypertension: Secondary | ICD-10-CM

## 2024-09-30 DIAGNOSIS — Z8774 Personal history of (corrected) congenital malformations of heart and circulatory system: Secondary | ICD-10-CM

## 2024-09-30 MED ORDER — LOSARTAN POTASSIUM 100 MG PO TABS: 100.0000 mg | ORAL_TABLET | Freq: Every day | ORAL | 1 refills | Status: AC

## 2024-09-30 MED ORDER — NEBIVOLOL HCL 5 MG PO TABS: 5.0000 mg | ORAL_TABLET | Freq: Every day | ORAL | 1 refills | Status: AC

## 2024-09-30 NOTE — Telephone Encounter (Signed)
 Copied from CRM #[id number]. Topic: Clinical - Prescription Issue >> Sep 30, 2024  9:04 AM Olam RAMAN wrote: Reason for CRM: Pt is calling about nebivolol  (BYSTOLIC ) 5 MG tablet  A message was sent to clinic and nothing has been rcvd yet and for losartan  (COZAAR ) 100 MG tablet only 2 refill left [facility] [phone]

## 2024-09-30 NOTE — Telephone Encounter (Signed)
 Prescription Request  09/30/2024  LOV: 08/13/24  What is the name of the medication or equipment? nebivolol  (BYSTOLIC ) 5 MG tablet [[id number]]   Have you contacted your pharmacy to request a refill? Yes   Which pharmacy would you like this sent to?  [id number] [place], [place] - [address] [place] [address] Phone: [phone] Fax: [phone]    Patient notified that their request is being sent to the clinical staff for review and that they should receive a response within 2 business days.   Please advise at [facility] [phone]

## 2024-10-02 NOTE — Telephone Encounter (Signed)
 Duplicate request, refilled 09/30/24.  Requested Prescriptions  Pending Prescriptions Disp Refills   nebivolol  (BYSTOLIC ) 5 MG tablet 90 tablet 1    Sig: Take 1 tablet (5 mg total) by mouth daily.     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   [facility] Pickard, Butler DASEN, MD   8 months ago General medical exam   [facility] Duanne Butler DASEN, MD   1 year ago General medical exam   [facility] Duanne Butler DASEN, MD              Passed - Cr in normal range and within 360 days    Creat  Date Value Ref Range Status  02/02/2024 0.93 0.70 - 1.30 mg/dL Final         Passed - AST in normal range and within 360 days    AST  Date Value Ref Range Status  02/02/2024 18 10 - 35 U/L Final         Passed - ALT in normal range and within 360 days    ALT  Date Value Ref Range Status  02/02/2024 24 9 - 46 U/L Final         Passed - Last BP in normal range    BP Readings from Last 1 Encounters:  08/13/24 130/80         Passed - Last Heart Rate in normal range    Pulse Readings from Last 1 Encounters:  08/13/24 62

## 2024-10-30 ENCOUNTER — Telehealth: Payer: Self-pay

## 2024-10-30 NOTE — Telephone Encounter (Signed)
 Copied from CRM #[id number]. Topic: General - Other >> Oct 30, 2024 10:37 AM Berneda FALCON wrote: Reason for CRM: Patient is requesting to be sent to the nurse voicemail for Dr. Duanne. I do not have the option for the voicemail and patient would not divulge the reason for the call when probed for more information.  Please call patient back at [phone] (home)

## 2024-11-07 ENCOUNTER — Ambulatory Visit: Admitting: Family Medicine
